# Patient Record
Sex: Male | Born: 1935 | Race: Black or African American | Hispanic: No | Marital: Single | Smoking: Never smoker
Health system: Southern US, Community
[De-identification: ages and names within clinical notes are randomized; demographics above are authoritative.]

## PROBLEM LIST (undated history)

## (undated) DIAGNOSIS — E785 Hyperlipidemia, unspecified: Secondary | ICD-10-CM

## (undated) DIAGNOSIS — C61 Malignant neoplasm of prostate: Secondary | ICD-10-CM

## (undated) DIAGNOSIS — I10 Essential (primary) hypertension: Secondary | ICD-10-CM

## (undated) HISTORY — DX: Essential (primary) hypertension: I10

## (undated) HISTORY — DX: Malignant neoplasm of prostate: C61

## (undated) HISTORY — DX: Hyperlipidemia, unspecified: E78.5

## (undated) HISTORY — PX: OTHER SURGICAL HISTORY: SHX169

---

## 2015-02-04 DIAGNOSIS — E039 Hypothyroidism, unspecified: Secondary | ICD-10-CM | POA: Diagnosis not present

## 2015-02-04 DIAGNOSIS — E119 Type 2 diabetes mellitus without complications: Secondary | ICD-10-CM | POA: Diagnosis not present

## 2015-02-04 DIAGNOSIS — E785 Hyperlipidemia, unspecified: Secondary | ICD-10-CM | POA: Diagnosis not present

## 2015-02-04 DIAGNOSIS — J309 Allergic rhinitis, unspecified: Secondary | ICD-10-CM | POA: Diagnosis not present

## 2015-02-04 DIAGNOSIS — I10 Essential (primary) hypertension: Secondary | ICD-10-CM | POA: Diagnosis not present

## 2015-06-07 DIAGNOSIS — J301 Allergic rhinitis due to pollen: Secondary | ICD-10-CM | POA: Diagnosis not present

## 2015-06-07 DIAGNOSIS — J329 Chronic sinusitis, unspecified: Secondary | ICD-10-CM | POA: Diagnosis not present

## 2015-06-07 DIAGNOSIS — J069 Acute upper respiratory infection, unspecified: Secondary | ICD-10-CM | POA: Diagnosis not present

## 2015-08-19 DIAGNOSIS — R05 Cough: Secondary | ICD-10-CM | POA: Diagnosis not present

## 2015-08-19 DIAGNOSIS — Z23 Encounter for immunization: Secondary | ICD-10-CM | POA: Diagnosis not present

## 2015-08-19 DIAGNOSIS — I10 Essential (primary) hypertension: Secondary | ICD-10-CM | POA: Diagnosis not present

## 2015-08-19 DIAGNOSIS — E039 Hypothyroidism, unspecified: Secondary | ICD-10-CM | POA: Diagnosis not present

## 2015-08-19 DIAGNOSIS — E119 Type 2 diabetes mellitus without complications: Secondary | ICD-10-CM | POA: Diagnosis not present

## 2015-08-19 DIAGNOSIS — E785 Hyperlipidemia, unspecified: Secondary | ICD-10-CM | POA: Diagnosis not present

## 2015-08-23 DIAGNOSIS — R918 Other nonspecific abnormal finding of lung field: Secondary | ICD-10-CM | POA: Diagnosis not present

## 2015-08-23 DIAGNOSIS — R911 Solitary pulmonary nodule: Secondary | ICD-10-CM | POA: Diagnosis not present

## 2015-08-23 DIAGNOSIS — R05 Cough: Secondary | ICD-10-CM | POA: Diagnosis not present

## 2015-08-27 ENCOUNTER — Other Ambulatory Visit: Payer: Self-pay | Admitting: Family Medicine

## 2015-08-27 DIAGNOSIS — R911 Solitary pulmonary nodule: Secondary | ICD-10-CM

## 2015-09-02 ENCOUNTER — Ambulatory Visit
Admission: RE | Admit: 2015-09-02 | Discharge: 2015-09-02 | Disposition: A | Discharge status: Home / Self Care | Payer: Medicare Other | Source: Ambulatory Visit | Attending: Family Medicine | Admitting: Family Medicine

## 2015-09-02 DIAGNOSIS — R911 Solitary pulmonary nodule: Secondary | ICD-10-CM | POA: Diagnosis not present

## 2015-09-06 ENCOUNTER — Other Ambulatory Visit: Payer: Self-pay | Admitting: *Deleted

## 2015-09-06 DIAGNOSIS — K7689 Other specified diseases of liver: Secondary | ICD-10-CM

## 2015-09-16 ENCOUNTER — Ambulatory Visit
Admission: RE | Admit: 2015-09-16 | Discharge: 2015-09-16 | Disposition: A | Discharge status: Home / Self Care | Payer: Medicare Other | Source: Ambulatory Visit | Attending: Family Medicine | Admitting: Family Medicine

## 2015-09-16 DIAGNOSIS — K7689 Other specified diseases of liver: Secondary | ICD-10-CM | POA: Diagnosis not present

## 2015-10-07 DIAGNOSIS — R05 Cough: Secondary | ICD-10-CM | POA: Diagnosis not present

## 2015-10-07 DIAGNOSIS — K769 Liver disease, unspecified: Secondary | ICD-10-CM | POA: Diagnosis not present

## 2015-10-07 DIAGNOSIS — I77810 Thoracic aortic ectasia: Secondary | ICD-10-CM | POA: Diagnosis not present

## 2015-10-07 DIAGNOSIS — J8410 Pulmonary fibrosis, unspecified: Secondary | ICD-10-CM | POA: Diagnosis not present

## 2015-10-07 DIAGNOSIS — K7689 Other specified diseases of liver: Secondary | ICD-10-CM | POA: Diagnosis not present

## 2015-10-07 DIAGNOSIS — R918 Other nonspecific abnormal finding of lung field: Secondary | ICD-10-CM | POA: Diagnosis not present

## 2015-10-07 DIAGNOSIS — E559 Vitamin D deficiency, unspecified: Secondary | ICD-10-CM | POA: Diagnosis not present

## 2015-10-28 DIAGNOSIS — R05 Cough: Secondary | ICD-10-CM | POA: Diagnosis not present

## 2015-11-05 DIAGNOSIS — R0602 Shortness of breath: Secondary | ICD-10-CM | POA: Diagnosis not present

## 2015-11-08 DIAGNOSIS — C61 Malignant neoplasm of prostate: Secondary | ICD-10-CM | POA: Diagnosis not present

## 2015-11-08 DIAGNOSIS — Z87891 Personal history of nicotine dependence: Secondary | ICD-10-CM | POA: Diagnosis not present

## 2015-11-08 DIAGNOSIS — R350 Frequency of micturition: Secondary | ICD-10-CM | POA: Diagnosis not present

## 2015-11-08 DIAGNOSIS — R972 Elevated prostate specific antigen [PSA]: Secondary | ICD-10-CM | POA: Diagnosis not present

## 2015-12-09 DIAGNOSIS — R918 Other nonspecific abnormal finding of lung field: Secondary | ICD-10-CM | POA: Diagnosis not present

## 2015-12-09 DIAGNOSIS — R05 Cough: Secondary | ICD-10-CM | POA: Diagnosis not present

## 2015-12-09 DIAGNOSIS — R0902 Hypoxemia: Secondary | ICD-10-CM | POA: Diagnosis not present

## 2016-02-10 DIAGNOSIS — E119 Type 2 diabetes mellitus without complications: Secondary | ICD-10-CM | POA: Diagnosis not present

## 2016-02-10 DIAGNOSIS — I10 Essential (primary) hypertension: Secondary | ICD-10-CM | POA: Diagnosis not present

## 2016-02-10 DIAGNOSIS — R05 Cough: Secondary | ICD-10-CM | POA: Diagnosis not present

## 2016-02-10 DIAGNOSIS — E039 Hypothyroidism, unspecified: Secondary | ICD-10-CM | POA: Diagnosis not present

## 2016-02-10 DIAGNOSIS — E785 Hyperlipidemia, unspecified: Secondary | ICD-10-CM | POA: Diagnosis not present

## 2016-02-18 DIAGNOSIS — G4733 Obstructive sleep apnea (adult) (pediatric): Secondary | ICD-10-CM | POA: Diagnosis not present

## 2016-03-02 DIAGNOSIS — R05 Cough: Secondary | ICD-10-CM | POA: Diagnosis not present

## 2016-03-24 ENCOUNTER — Ambulatory Visit (INDEPENDENT_AMBULATORY_CARE_PROVIDER_SITE_OTHER): Payer: Medicare Other | Admitting: Internal Medicine

## 2016-03-24 ENCOUNTER — Encounter: Payer: Self-pay | Admitting: Internal Medicine

## 2016-03-24 VITALS — BP 128/70 | HR 74 | Ht 64.0 in | Wt 157.8 lb

## 2016-03-24 DIAGNOSIS — R058 Other specified cough: Secondary | ICD-10-CM

## 2016-03-24 DIAGNOSIS — R05 Cough: Secondary | ICD-10-CM | POA: Diagnosis not present

## 2016-03-24 MED ORDER — ACETAMINOPHEN-CODEINE #3 300-30 MG PO TABS
1.0000 | ORAL_TABLET | ORAL | Status: DC | PRN
Start: 2016-03-24 — End: 2016-04-22

## 2016-03-24 MED ORDER — PANTOPRAZOLE SODIUM 40 MG PO TBEC: 40.0000 mg | DELAYED_RELEASE_TABLET | Freq: Every day | ORAL | Status: DC

## 2016-03-24 MED ORDER — FAMOTIDINE 20 MG PO TABS: ORAL_TABLET | ORAL | Status: DC

## 2016-03-24 NOTE — Progress Notes (Addendum)
   Subjective:    Patient ID: Steven Matthews, male    DOB: 03/06/1931,    MRN: [id number]  HPI  11 yobm ? Mentally retarded  never smoker referred to pulmonary clinic 03/24/2016 by Dr Rory Percy in [place] [facility] for cough   03/25/2016 1st [facility] office visit/ Steven Matthews   Chief Complaint  Patient presents with  . Pulmonary Consult    Referred by Dr. Hayden Pedro. Pt c/o cough for the past year. Cough is non prod and occurs without any specific trigger.    onset was insidious pattern is persistent daily assoc with urge to clear throat but worse day than noct / sporadic and niece assures not related to eating or activity/ exposures. Per pulmonary notes neg resp to ppi,  singulair or zyrtec with neg CT chest and nl pfts done this past year and neg RAST dated 10/07/15   No obvious other patterns in day to day or daytime variabilty or assoc limiting sob or cp or chest tightness, subjective wheeze overt sinus or hb symptoms. No unusual exp hx or h/o childhood pna/ asthma or knowledge of premature birth.  Sleeping ok without nocturnal  or early am exacerbation  of respiratory  c/o's or need for noct saba. Also denies any obvious fluctuation of symptoms with weather or environmental changes or other aggravating or alleviating factors except as outlined above   Current Medications, Allergies, Complete Past Medical History, Past Surgical History, Family History, and Social History were reviewed in [employer] record.               Review of Systems  Constitutional: Negative for fever, chills, activity change, appetite change and unexpected weight change.  HENT: Positive for dental problem. Negative for congestion, postnasal drip, rhinorrhea, sneezing, sore throat, trouble swallowing and voice change.   Eyes: Negative for visual disturbance.  Respiratory: Positive for cough. Negative for choking and shortness of breath.   Cardiovascular: Negative for chest pain and leg swelling.    Gastrointestinal: Negative for nausea, vomiting and abdominal pain.  Genitourinary: Negative for difficulty urinating.  Musculoskeletal: Negative for arthralgias.  Skin: Negative for rash.  Psychiatric/Behavioral: Negative for behavioral problems and confusion.       Objective:   Physical Exam  amb elderly bm not sure why he's here/ very inarticulate even with yes no responses/ silly affect  Wt Readings from Last 3 Encounters:  03/24/16 157 lb 12.8 oz (71.578 kg)    Vital signs reviewed    HEENT: nl   turbinates, and oropharynx. Nl external ear canals without cough reflex - very poor dentition   NECK :  without JVD/Nodes/TM/ nl carotid upstrokes bilaterally   LUNGS: no acc muscle use,  Nl contour chest which is clear to A and P bilaterally without cough on insp or exp maneuvers   CV:  RRR  no s3 or murmur or increase in P2, no edema   ABD:  soft and nontender with nl inspiratory excursion in the supine position. No bruits or organomegaly, bowel sounds nl  MS:  Nl gait/ ext warm without deformities, calf tenderness, cyanosis or clubbing No obvious joint restrictions   SKIN: warm and dry without lesions    NEURO:  alert, childlike , nl sensorium with  no motor deficits apparent   CT chest 10/07/15 Calcified granuloma only           Assessment & Plan:

## 2016-03-24 NOTE — Patient Instructions (Addendum)
Pantoprazole (protonix) 40 mg(omeprazole 20 x 2))    Take  30-60 min before first meal of the day and Pepcid (famotidine)  20 mg one @  bedtime until return to office - this is the best way to tell whether stomach acid is contributing to your problem.       For drainage / throat tickle try take CHLORPHENIRAMINE  4 mg - take one every 4 hours as needed - available over the counter- may cause drowsiness so start with just a bedtime dose or two and see how you tolerate it before trying in daytime     GERD (REFLUX)  is an extremely common cause of respiratory symptoms just like yours , many times with no obvious heartburn at all.    It can be treated with medication, but also with lifestyle changes including elevation of the head of your bed (ideally with 6 inch  bed blocks),  Smoking cessation, avoidance of late meals, excessive alcohol, and avoid fatty foods, chocolate, peppermint, colas, red wine, and acidic juices such as orange juice.  NO MINT OR MENTHOL PRODUCTS SO NO COUGH DROPS  USE SUGARLESS CANDY INSTEAD (Jolley ranchers or Stover's or Life Savers) or even ice chips will also do - the key is to swallow to prevent all throat clearing. NO OIL BASED VITAMINS - use powdered substitutes.  If still bothered by cough ok to try tylenol #3 up to one every 4 hours as needed   If not better after 4 weeks please return

## 2016-03-25 DIAGNOSIS — R05 Cough: Secondary | ICD-10-CM | POA: Insufficient documentation

## 2016-03-25 DIAGNOSIS — R058 Other specified cough: Secondary | ICD-10-CM | POA: Insufficient documentation

## 2016-03-25 NOTE — Assessment & Plan Note (Signed)
The most common causes of chronic cough in immunocompetent adults include the following: upper airway cough syndrome (UACS), previously referred to as postnasal drip syndrome (PNDS), which is caused by variety of rhinosinus conditions; (2) asthma; (3) GERD; (4) chronic bronchitis from cigarette smoking or other inhaled environmental irritants; (5) nonasthmatic eosinophilic bronchitis; and (6) bronchiectasis.   These conditions, singly or in combination, have accounted for up to 94% of the causes of chronic cough in prospective studies.   Other conditions have constituted no >6% of the causes in prospective studies These have included bronchogenic carcinoma, chronic interstitial pneumonia, sarcoidosis, left ventricular failure, ACEI-induced cough, and aspiration from a condition associated with pharyngeal dysfunction.    Chronic cough is often simultaneously caused by more than one condition. A single cause has been found from 38 to 82% of the time, multiple causes from 18 to 62%. Multiply caused cough has been the result of three diseases up to 42% of the time.       Based on hx and exam, this is most likely:  Classic Upper airway cough syndrome, so named because it's frequently impossible to sort out how much is  CR/sinusitis with freq throat clearing (which can be related to primary GERD)   vs  causing  secondary (" extra esophageal")  GERD from wide swings in gastric pressure that occur with throat clearing, often  promoting self use of mint and menthol lozenges that reduce the lower esophageal sphincter tone and exacerbate the problem further in a cyclical fashion.   These are the same pts (now being labeled as having "irritable larynx syndrome" by some cough centers) who not infrequently have a history of having failed to tolerate ace inhibitors,  dry powder inhalers or biphosphonates or report having atypical reflux symptoms that don't respond to standard doses of PPI , and are easily confused as  having aecopd or asthma flares by even experienced allergists/ pulmonologists.   The first step is to maximize acid suppression / gerd diet/ H1 and eliminate cyclical coughing with hard rock  then regroup if the cough persists.  I had an extended discussion with the patient and niece reviewing all relevant studies completed to date and  lasting 35 min/60 min initial ov including extensive review of the w/u already done in [place] by pulmonary   Of the three most common causes of chronic cough, only one (GERD)  can actually cause the other two (asthma and post nasal drip syndrome)  and perpetuate the cylce of cough inducing airway trauma, inflammation, heightened sensitivity to reflux which is prompted by the cough itself via a cyclical mechanism.    This may partially respond to steroids and look like asthma and post nasal drainage but never erradicated completely unless the cough and the secondary reflux are eliminated, preferably both at the same time.  While not intuitively obvious, many patients with chronic low grade reflux do not cough until there is a secondary insult that disturbs the protective epithelial barrier and exposes sensitive nerve endings.  This can be viral or direct physical injury such as with an endotracheal tube.   The point is that once this occurs, it is difficult to eliminate using anything but a maximally effective acid suppression regimen at least in the short run, accompanied by an appropriate diet to address non acid GERD.   Each maintenance medication was reviewed in detail including most importantly the difference between maintenance and as needed and under what circumstances the prns are to be used.  Please see instructions for details which were reviewed in writing and the patient given a copy.

## 2016-04-01 ENCOUNTER — Telehealth: Payer: Self-pay | Admitting: Internal Medicine

## 2016-04-01 DIAGNOSIS — R05 Cough: Secondary | ICD-10-CM

## 2016-04-01 DIAGNOSIS — R058 Other specified cough: Secondary | ICD-10-CM

## 2016-04-01 MED ORDER — GABAPENTIN 100 MG PO CAPS: 100.0000 mg | ORAL_CAPSULE | Freq: Three times a day (TID) | ORAL | Status: DC

## 2016-04-01 NOTE — Telephone Encounter (Signed)
Start neurontin 100 mg tid and f/u in 2 weeks with all meds in hand and ok to refill tyl#3 x one refill to use prn

## 2016-04-01 NOTE — Telephone Encounter (Signed)
Spoke with Velva Harman, pt's niece, and advised of MW's recommendations. She would like him to start the Nuerontin but does not want to give him Tylenol#3 again. She states that the last time he took it he got very light-headed, hot and felt like he was "going to pass out". She made appt for 2 weeks with TP for med calendar. Rx sent to pharmacy. Nothing further needed.

## 2016-04-01 NOTE — Telephone Encounter (Signed)
Spoke with Rita(niece), cough is worse since last OV, non-productive and frequent.  Patient is using all meds as directed and still having cough. Pt's sister passed away and he will be attending the funeral this week and is asking for something to be called in for additional cough suppressant for short term use. Please advise Dr Melvyn Novas. Thanks.  Patient Instructions     Pantoprazole (protonix) 40 mg(omeprazole 20 x 2)) Take 30-60 min before first meal of the day and Pepcid (famotidine) 20 mg one @ bedtime until return to office - this is the best way to tell whether stomach acid is contributing to your problem.   For drainage / throat tickle try take CHLORPHENIRAMINE 4 mg - take one every 4 hours as needed - available over the counter- may cause drowsiness so start with just a bedtime dose or two and see how you tolerate it before trying in daytime   GERD (REFLUX) is an extremely common cause of respiratory symptoms just like yours , many times with no obvious heartburn at all.   It can be treated with medication, but also with lifestyle changes including elevation of the head of your bed (ideally with 6 inch bed blocks), Smoking cessation, avoidance of late meals, excessive alcohol, and avoid fatty foods, chocolate, peppermint, colas, red wine, and acidic juices such as orange juice.  NO MINT OR MENTHOL PRODUCTS SO NO COUGH DROPS  USE SUGARLESS CANDY INSTEAD (Jolley ranchers or Stover's or Life Savers) or even ice chips will also do - the key is to swallow to prevent all throat clearing. NO OIL BASED VITAMINS - use powdered substitutes.  If still bothered by cough ok to try tylenol #3 up to one every 4 hours as needed   If not better after 4 weeks please return

## 2016-04-17 ENCOUNTER — Encounter: Payer: Self-pay | Admitting: Internal Medicine

## 2016-04-17 ENCOUNTER — Ambulatory Visit (INDEPENDENT_AMBULATORY_CARE_PROVIDER_SITE_OTHER): Payer: Medicare Other | Admitting: Adult Health

## 2016-04-17 ENCOUNTER — Encounter: Payer: Self-pay | Admitting: Adult Health

## 2016-04-17 ENCOUNTER — Telehealth: Payer: Self-pay | Admitting: Internal Medicine

## 2016-04-17 VITALS — BP 130/76 | HR 74 | Temp 98.1°F | Ht 64.0 in | Wt 157.0 lb

## 2016-04-17 DIAGNOSIS — R058 Other specified cough: Secondary | ICD-10-CM

## 2016-04-17 DIAGNOSIS — R05 Cough: Secondary | ICD-10-CM

## 2016-04-17 NOTE — Progress Notes (Signed)
Subjective:    Patient ID: Steven Matthews, male    DOB: Jan 10, 1931,    MRN: [id number]  HPI  7 yobm ? Mentally retarded  never smoker referred to pulmonary clinic 03/24/2016 by Dr Rory Percy in [place] for cough   03/25/2016 1st [facility] office visit/ Wert   Chief Complaint  Patient presents with  . Pulmonary Consult    Referred by Dr. Hayden Pedro. Pt c/o cough for the past year. Cough is non prod and occurs without any specific trigger.    onset was insidious pattern is persistent daily assoc with urge to clear throat but worse day than noct / sporadic and niece assures not related to eating or activity/ exposures. Per pulmonary notes neg resp to ppi,  singulair or zyrtec with neg CT chest and nl pfts done this past year and neg RAST dated 10/07/15  >>PPI rx . , Gabapentin , chlor tabls   04/17/2016 Follow up :  Patient returns for a two-week follow-up for cough and medication review We reviewed all her medications organize them into a medication calendar with patient education.  Appears to be taking medications correctly except for taking protonix and omprazole and not pepcid.  Patient was seen last visit for pulmonary consult for cough. He was started on cough control regimen along with GERD tx.  Has had only minimal improvement in cough.  Could not tolerate tylenol #3 for cough .  Denies any chest pain, orthopnea, PND, or increased leg swelling.  Current Medications, Allergies, Complete Past Medical History, Past Surgical History, Family History, and Social History were reviewed in [employer] record.    Review of Systems  Constitutional:   No  weight loss, night sweats,  Fevers, chills, fatigue, or  lassitude.  HEENT:   No headaches,  Difficulty swallowing,  Tooth/dental problems, or  Sore throat,                No sneezing, itching, ear ache,  +nasal congestion, post nasal drip,   CV:  No chest pain,  Orthopnea, PND, swelling in lower extremities,  anasarca, dizziness, palpitations, syncope.   GI  No heartburn, indigestion, abdominal pain, nausea, vomiting, diarrhea, change in bowel habits, loss of appetite, bloody stools.   Resp:    No chest wall deformity  Skin: no rash or lesions.  GU: no dysuria, change in color of urine, no urgency or frequency.  No flank pain, no hematuria   MS:  No joint pain or swelling.  No decreased range of motion.  No back pain.  Psych:  No change in mood or affect. No depression or anxiety.  No memory loss.         Objective:   Physical Exam  amb elderly Filed Vitals:   04/17/16 1519  BP: 130/76  Pulse: 74  Temp: 98.1 F (36.7 C)  TempSrc: Oral  Height: 5\' 4"  (1.626 m)  Weight: 157 lb (71.215 kg)  SpO2: 93%    Vital signs reviewed    HEENT: nl   turbinates, and oropharynx. Nl external ear canals without cough reflex - very poor dentition   NECK :  without JVD/Nodes/TM/ nl carotid upstrokes bilaterally   LUNGS: no acc muscle use,  Nl contour chest which is clear to A and P bilaterally without cough on insp or exp maneuvers   CV:  RRR  no s3 or murmur or increase in P2, no edema   ABD:  soft and nontender with nl inspiratory excursion in  the supine position. No bruits or organomegaly, bowel sounds nl  MS:  Nl gait/ ext warm without deformities, calf tenderness, cyanosis or clubbing No obvious joint restrictions   SKIN: warm and dry without lesions    NEURO:  alert, childlike , nl sensorium with  no motor deficits apparent   CT chest 10/07/15 Calcified granuloma only    Dearia Wilmouth NP-C  [facility]  04/17/2016          Assessment & Plan:

## 2016-04-17 NOTE — Progress Notes (Signed)
Chart and office note reviewed in detail  > agree with a/p as outlined    

## 2016-04-17 NOTE — Telephone Encounter (Signed)
Spoke with Velva Harman. States that pt need a prescription for Pepcid. Advised her that this is OTC. She states that she will go pick this up. Nothing further was needed.

## 2016-04-17 NOTE — Assessment & Plan Note (Signed)
Patient's medications were reviewed today and patient education was given. Computerized medication calendar was adjusted/completed   Plan   May take Ompeprazole or Protonix daily before meal  Add Pepcid 20mg  At bedtime   May use Delsym 2 tsp Twice daily  As needed  Cough.  Follow med calendar closely and bring to each visit.  Follow up Dr. Melvyn Novas  In 6-8 weeks and As needed   Please contact office for sooner follow up if symptoms do not improve or worsen or seek emergency care

## 2016-04-17 NOTE — Patient Instructions (Addendum)
May take Ompeprazole or Protonix daily before meal  Add Pepcid 20mg  At bedtime   May use Delsym 2 tsp Twice daily  As needed  Cough.  Follow med calendar closely and bring to each visit.  Follow up Dr. Melvyn Novas  In 6-8 weeks and As needed   Please contact office for sooner follow up if symptoms do not improve or worsen or seek emergency care

## 2016-04-22 ENCOUNTER — Encounter: Payer: Self-pay | Admitting: Internal Medicine

## 2016-04-22 NOTE — Addendum Note (Signed)
Addended by: Osa Craver on: 04/22/2016 12:06 PM   Modules accepted: Orders, Medications

## 2016-04-23 ENCOUNTER — Encounter: Payer: Self-pay | Admitting: Internal Medicine

## 2016-04-23 DIAGNOSIS — J9611 Chronic respiratory failure with hypoxia: Secondary | ICD-10-CM | POA: Insufficient documentation

## 2016-04-23 DIAGNOSIS — G4733 Obstructive sleep apnea (adult) (pediatric): Secondary | ICD-10-CM | POA: Insufficient documentation

## 2016-06-02 ENCOUNTER — Other Ambulatory Visit (INDEPENDENT_AMBULATORY_CARE_PROVIDER_SITE_OTHER): Payer: Medicare Other

## 2016-06-02 ENCOUNTER — Encounter: Payer: Self-pay | Admitting: Internal Medicine

## 2016-06-02 ENCOUNTER — Ambulatory Visit (INDEPENDENT_AMBULATORY_CARE_PROVIDER_SITE_OTHER): Payer: Medicare Other | Admitting: Internal Medicine

## 2016-06-02 VITALS — BP 130/78 | HR 74 | Ht 64.0 in | Wt 163.0 lb

## 2016-06-02 DIAGNOSIS — R05 Cough: Secondary | ICD-10-CM | POA: Diagnosis not present

## 2016-06-02 DIAGNOSIS — R058 Other specified cough: Secondary | ICD-10-CM

## 2016-06-02 LAB — CBC WITH DIFFERENTIAL/PLATELET
BASOS ABS: 0.1 10*3/uL (ref 0.0–0.1)
Basophils Relative: 1 % (ref 0.0–3.0)
EOS PCT: 7.3 % — AB (ref 0.0–5.0)
Eosinophils Absolute: 0.5 10*3/uL (ref 0.0–0.7)
HEMATOCRIT: 38.1 % — AB (ref 39.0–52.0)
Hemoglobin: 12.6 g/dL — ABNORMAL LOW (ref 13.0–17.0)
LYMPHS PCT: 25.1 % (ref 12.0–46.0)
Lymphs Abs: 1.6 10*3/uL (ref 0.7–4.0)
MCHC: 33 g/dL (ref 30.0–36.0)
MCV: 85.2 fl (ref 78.0–100.0)
MONOS PCT: 10.1 % (ref 3.0–12.0)
Monocytes Absolute: 0.7 10*3/uL (ref 0.1–1.0)
NEUTROS ABS: 3.7 10*3/uL (ref 1.4–7.7)
Neutrophils Relative %: 56.5 % (ref 43.0–77.0)
PLATELETS: 205 10*3/uL (ref 150.0–400.0)
RBC: 4.47 Mil/uL (ref 4.22–5.81)
RDW: 16.3 % — ABNORMAL HIGH (ref 11.5–15.5)
WBC: 6.5 10*3/uL (ref 4.0–10.5)

## 2016-06-02 NOTE — Patient Instructions (Addendum)
Add chlortabs x 2 and pepcid 20mg  at one hour before bedtime to see what the impact is  Change omeprazole to take x 2 = 40 mg or 40 mg of pantoprazole before first meal  See calendar for specific medication instructions and bring it back for each and every office visit for every healthcare provider you see.  Without it,  you may not receive the best quality medical care that we feel you deserve.  You will note that the calendar groups together  your maintenance  medications that are timed at particular times of the day.  Think of this as your checklist for what your doctor has instructed you to do until your next evaluation to see what benefit  there is  to staying on a consistent group of medications intended to keep you well.  The other group at the bottom is entirely up to you to use as you see fit  for specific symptoms that may arise between visits that require you to treat them on an as needed basis.  Think of this as your action plan or "what if" list.   Separating the top medications from the bottom group is fundamental to providing you adequate care going forward.    Please remember to go to the lab   department downstairs for your tests - we will call you with the results when they are available.     Please schedule a follow up office visit in 6 weeks, call sooner if needed  Next step sinus ct/ DgEs

## 2016-06-02 NOTE — Progress Notes (Signed)
Subjective:    Patient ID: Steven Matthews, male    DOB: October 12, 1931,    MRN: [id number]    Brief patient profile:  85 yobm ? Mentally retarded  never smoker referred to pulmonary clinic 03/24/2016 by Dr Rory Percy in [place] for cough onset late spring /summer 2016     History of Present Illness  03/25/2016 1st [facility] office visit/ Wert   Chief Complaint  Patient presents with  . Pulmonary Consult    Referred by Dr. Hayden Pedro. Pt c/o cough for the past year. Cough is non prod and occurs without any specific trigger.   onset was insidious pattern is persistent daily assoc with urge to clear throat but worse day than noct / sporadic and niece assures not related to eating or activity/ exposures. Per pulmonary notes neg resp to ppi,  singulair or zyrtec with neg CT chest and nl pfts done this past year and neg RAST dated 10/07/15  >>PPI rx . , Gabapentin 100 , chlor tabls     04/17/2016 NP Follow up :  Patient returns for a two-week follow-up for cough and medication review We reviewed all her medications organize them into a medication calendar with patient education.  Appears to be taking medications correctly except for taking protonix and omprazole and not pepcid.  Patient was seen last visit for pulmonary consult for cough. He was started on cough control regimen along with GERD tx.  Has had only minimal improvement in cough.  Could not tolerate tylenol #3 for cough .  rec May take Ompeprazole or Protonix daily before meal  Add Pepcid 20mg  At bedtime   May use Delsym 2 tsp Twice daily  As needed  Cough.     06/02/2016  f/u ov/Wert re: chronic  cough / no med calendar on gaabpentin 100 tid/prilosec/pepcid  Chief Complaint  Patient presents with  . Follow-up    pt states cough is baseline since last OV. c/o cont non prod. no new concerns today.  noct cough is consistent w/in a few minutes of lying down but no excess/ purulent sputum or mucus plugs  Or hemoptysis No relation to  meals, sporadic during the day, no better with delsym   Not limited by breathing from desired activities    No obvious day to day or daytime variability or assoc  cp or chest tightness, subjective wheeze or overt sinus or hb symptoms. No unusual exp hx or h/o childhood pna/ asthma or knowledge of premature birth.  Sleeping ok without nocturnal  or early am exacerbation  of respiratory  c/o's or need for noct saba. Also denies any obvious fluctuation of symptoms with weather or environmental changes or other aggravating or alleviating factors except as outlined above   Current Medications, Allergies, Complete Past Medical History, Past Surgical History, Family History, and Social History were reviewed in [employer] record.  ROS  The following are not active complaints unless bolded sore throat, dysphagia, dental problems, itching, sneezing,  nasal congestion or excess/ purulent secretions, ear ache,   fever, chills, sweats, unintended wt loss, classically pleuritic or exertional cp, hemoptysis,  orthopnea pnd or leg swelling, presyncope, palpitations, abdominal pain, anorexia, nausea, vomiting, diarrhea  or change in bowel or bladder habits, change in stools or urine, dysuria,hematuria,  rash, arthralgias, visual complaints, headache, numbness, weakness or ataxia or problems with walking or coordination,  change in mood/affect or memory.  Objective:   Physical Exam  amb elderly bm nad   Wt Readings from Last 3 Encounters:  06/02/16 163 lb (73.936 kg)  04/17/16 157 lb (71.215 kg)  03/24/16 157 lb 12.8 oz (71.578 kg)    Vital signs reviewed    HEENT: nl   turbinates, and oropharynx. Nl external ear canals without cough reflex - very poor dentition   NECK :  without JVD/Nodes/TM/ nl carotid upstrokes bilaterally   LUNGS: no acc muscle use,  Nl contour chest which is clear to A and P bilaterally without cough on insp or exp maneuvers   CV:   RRR  no s3 or murmur or increase in P2, no edema   ABD:  soft and nontender with nl inspiratory excursion in the supine position. No bruits or organomegaly, bowel sounds nl  MS:  Nl gait/ ext warm without deformities, calf tenderness, cyanosis or clubbing No obvious joint restrictions   SKIN: warm and dry without lesions    NEURO:  alert, childlike , nl sensorium with  no motor deficits apparent      CT chest 10/07/15 Calcified granuloma only            Assessment & Plan:

## 2016-06-03 LAB — RESPIRATORY ALLERGY PROFILE REGION II ~~LOC~~
Allergen, Comm Silver Birch, t9: <0.1 kU/L
Allergen, Cottonwood, t14: <0.1 kU/L
Allergen, D pternoyssinus,d7: <0.1 kU/L
Allergen, Oak,t7: <0.1 kU/L
Bermuda Grass: <0.1 kU/L
Box Elder IgE: <0.1 kU/L
Cat Dander: <0.1 kU/L
D. farinae: <0.1 kU/L
Dog Dander: <0.1 kU/L
Elm IgE: <0.1 kU/L
IgE (Immunoglobulin E), Serum: 172 kU/L — ABNORMAL HIGH (ref <115)
Sheep Sorrel IgE: <0.1 kU/L

## 2016-06-03 NOTE — Progress Notes (Signed)
Quick Note:  Spoke with pt and notified of results per Dr. Wert. Pt verbalized understanding and denied any questions.  ______ 

## 2016-06-07 NOTE — Assessment & Plan Note (Signed)
PFTs 10/28/15 [place] > wnl Trial of max gerd/ H1/ diet started 03/25/2016 >>>  - 04/01/2016 no better so rec gabapentin 100 mg tid x 2 weeks trial then ov 04/17/2016 med calendar  - Allergy profile 06/02/2016 >  Eos 0.5 /  IgE  172 neg RAST  I had an extended discussion with the patient/care taker  reviewing all relevant studies completed to date and  lasting 15 to 20 minutes of a 25 minute visit on the following ongoing concerns:   The standardized cough guidelines published in Chest by Lissa Morales in 2006 are still the best available and consist of a multiple step process (up to 12!) , not a single office visit,  and are intended  to address this problem logically,  with an alogrithm dependent on response to empiric treatment at  each progressive step  to determine a specific diagnosis with  minimal addtional testing needed. Therefore if adherence is an issue or can't be accurately verified,  it's very unlikely the standard evaluation and treatment will be successful here.    Furthermore, response to therapy (other than acute cough suppression, which should only be used short term with avoidance of narcotic containing cough syrups if possible), can be a gradual process for which the patient is not likely to  perceive immediate benefit.  Unlike going to an eye doctor where the best perscription is almost always the first one and is immediately effective, this is almost never the case in the management of chronic cough syndromes. Therefore the patient needs to commit up front to consistently adhere to recommendations  for up to 6 weeks of therapy directed at the likely underlying problem(s) before the response can be reasonably evaluated.   Emphasized use of med calendar to assure instructions are being followed correctly/consistently before considering additional steps     Each maintenance medication was reviewed in detail including most importantly the difference between maintenance and as needed  and under what circumstances the prns are to be used. This was done in the context of a medication calendar review which provided the patient with a user-friendly unambiguous mechanism for medication administration and reconciliation and provides an action plan for all active problems. It is critical that this be shown to every doctor  for modification during the office visit if necessary so the patient can use it as a working document.

## 2016-06-08 DIAGNOSIS — H25013 Cortical age-related cataract, bilateral: Secondary | ICD-10-CM | POA: Diagnosis not present

## 2016-06-08 DIAGNOSIS — H40003 Preglaucoma, unspecified, bilateral: Secondary | ICD-10-CM | POA: Diagnosis not present

## 2016-06-08 DIAGNOSIS — H40013 Open angle with borderline findings, low risk, bilateral: Secondary | ICD-10-CM | POA: Diagnosis not present

## 2016-06-08 DIAGNOSIS — Z83511 Family history of glaucoma: Secondary | ICD-10-CM | POA: Diagnosis not present

## 2016-07-14 ENCOUNTER — Ambulatory Visit (INDEPENDENT_AMBULATORY_CARE_PROVIDER_SITE_OTHER)
Admission: RE | Admit: 2016-07-14 | Discharge: 2016-07-14 | Disposition: A | Discharge status: Home / Self Care | Payer: Medicare Other | Source: Ambulatory Visit | Attending: Internal Medicine | Admitting: Internal Medicine

## 2016-07-14 ENCOUNTER — Encounter (INDEPENDENT_AMBULATORY_CARE_PROVIDER_SITE_OTHER): Payer: Self-pay

## 2016-07-14 ENCOUNTER — Encounter: Payer: Self-pay | Admitting: Internal Medicine

## 2016-07-14 ENCOUNTER — Ambulatory Visit (INDEPENDENT_AMBULATORY_CARE_PROVIDER_SITE_OTHER): Payer: Medicare Other | Admitting: Internal Medicine

## 2016-07-14 VITALS — BP 122/74 | HR 71 | Wt 165.0 lb

## 2016-07-14 DIAGNOSIS — R058 Other specified cough: Secondary | ICD-10-CM

## 2016-07-14 DIAGNOSIS — R05 Cough: Secondary | ICD-10-CM | POA: Diagnosis not present

## 2016-07-14 MED ORDER — GABAPENTIN 300 MG PO CAPS: 300.0000 mg | ORAL_CAPSULE | Freq: Three times a day (TID) | ORAL | 11 refills | Status: DC

## 2016-07-14 NOTE — Patient Instructions (Addendum)
Increase the neurontin to 300 mg three times as per med caldendar  Please see patient coordinator before you leave today  to schedule swallowing eval   Please remember to go to the   x-ray department downstairs for your tests - we will call you with the results when they are available.  See Tammy NP in 6 weeks with all your medications, even over the counter meds, separated in two separate bags, the ones you take no matter what vs the ones you stop once you feel better and take only as needed when you feel you need them.   Tammy  will generate for you a new user friendly medication calendar that will put [place] all on the same page re: your medication use.     Without this process, it simply isn't possible to assure that we are providing  your outpatient care  with  the attention to detail we feel you deserve.   If we cannot assure that you're getting that kind of care,  then we cannot manage your problem effectively from this clinic.  Once you have seen Tammy and we are sure that we're all on the same page with your medication use she will arrange follow up with me.  Add note records show noct desat in [place] ? Ever rx  02? Need to repeat ono?

## 2016-07-14 NOTE — Progress Notes (Signed)
Subjective:    Patient ID: Steven Matthews, male    DOB: 1931/08/21,    MRN: [id number]    Brief patient profile:  85 yobm ? Mentally retarded  never smoker referred to pulmonary clinic 03/24/2016 by Dr Rory Percy in [place] for cough onset late spring /summer 2016     History of Present Illness  03/25/2016 1st [facility] office visit/ Wert   Chief Complaint  Patient presents with  . Pulmonary Consult    Referred by Dr. Hayden Pedro. Pt c/o cough for the past year. Cough is non prod and occurs without any specific trigger.   onset was insidious pattern is persistent daily assoc with urge to clear throat but worse day than noct / sporadic and niece assures not related to eating or activity/ exposures. Per pulmonary notes neg resp to ppi,  singulair or zyrtec with neg CT chest and nl pfts done this past year and neg RAST dated 10/07/15  >>PPI rx . , Gabapentin 100 , chlor tabls     04/17/2016 NP Follow up :  Patient returns for a two-week follow-up for cough and medication review We reviewed all her medications organize them into a medication calendar with patient education.  Appears to be taking medications correctly except for taking protonix and omprazole and not pepcid.  Patient was seen last visit for pulmonary consult for cough. He was started on cough control regimen along with GERD tx.  Has had only minimal improvement in cough.  Could not tolerate tylenol #3 for cough .  rec May take Ompeprazole or Protonix daily before meal  Add Pepcid 20mg  At bedtime   May use Delsym 2 tsp Twice daily  As needed  Cough.     06/02/2016  f/u ov/Wert re: chronic  cough / no med calendar on gaabpentin 100 tid/prilosec/pepcid  Chief Complaint  Patient presents with  . Follow-up    pt states cough is baseline since last OV. c/o cont non prod. no new concerns today.  noct cough is consistent w/in a few minutes of lying down but no excess/ purulent sputum or mucus plugs  Or hemoptysis No relation to  meals, sporadic during the day, no better with delsym  rec Add chlortabs x 2 and pepcid 20mg  at one hour before bedtime to see what the impact is Change omeprazole to take x 2 = 40 mg or 40 mg of pantoprazole before first meal Separating the top medications from the bottom group is fundamental to providing you adequate care going forward.   Please remember to go to the lab   department > Neg RAST  Next step sinus ct/ DgEs     07/14/2016  f/u ov/Wert re: cough x at least spring 2016 when moved in with Niece / using med calendar  Chief Complaint  Patient presents with  . Follow-up    Cough is unchanged.   cough continues q hs and sporadic daytime/ dry  Not limited by breathing from desired activities    No obvious day to day or daytime variability or assoc excess/ purulent sputum or mucus plugs   cp or chest tightness, subjective wheeze or overt sinus or hb symptoms. No unusual exp hx or h/o childhood pna/ asthma or knowledge of premature birth.  Sleeping ok without nocturnal  or early am exacerbation  of respiratory  c/o's or need for noct saba. Also denies any obvious fluctuation of symptoms with weather or environmental changes or other aggravating or alleviating factors except as outlined  above   Current Medications, Allergies, Complete Past Medical History, Past Surgical History, Family History, and Social History were reviewed in [employer] record.  ROS  The following are not active complaints unless bolded sore throat, dysphagia, dental problems, itching, sneezing,  nasal congestion or excess/ purulent secretions, ear ache,   fever, chills, sweats, unintended wt loss, classically pleuritic or exertional cp, hemoptysis,  orthopnea pnd or leg swelling, presyncope, palpitations, abdominal pain, anorexia, nausea, vomiting, diarrhea  or change in bowel or bladder habits, change in stools or urine, dysuria,hematuria,  rash, arthralgias, visual complaints, headache,  numbness, weakness or ataxia or problems with walking or coordination,  change in mood/affect or memory.            Objective:   Physical Exam  amb elderly mentally retarded bm nad/ does not identify niece and never coughed once during interview or exam     07/14/2016      165  06/02/16 163 lb (73.936 kg)  04/17/16 157 lb (71.215 kg)  03/24/16 157 lb 12.8 oz (71.578 kg)    Vital signs reviewed    HEENT: nl   turbinates, and oropharynx. Nl external ear canals without cough reflex - very poor dentition   NECK :  without JVD/Nodes/TM/ nl carotid upstrokes bilaterally   LUNGS: no acc muscle use,  Nl contour chest which is clear to A and P bilaterally without cough on insp or exp maneuvers   CV:  RRR  no s3 or murmur or increase in P2, no edema   ABD:  soft and nontender with nl inspiratory excursion in the supine position. No bruits or organomegaly, bowel sounds nl  MS:  Nl gait/ ext warm without deformities, calf tenderness, cyanosis or clubbing No obvious joint restrictions   SKIN: warm and dry without lesions    NEURO:  alert, childlike , nl sensorium with  no motor deficits apparent       CXR PA and Lateral:   07/14/2016 :    I personally reviewed images and agree with radiology impression as follows:   There is no acute cardiopulmonary abnormality. Stable appearing calcified right upper lobe nodule consistent with previous granulomatous infection or hamartoma.            Assessment & Plan:

## 2016-07-15 NOTE — Assessment & Plan Note (Addendum)
PFTs 10/28/15 [place] > wnl Trial of max gerd/ H1/ diet started 03/25/2016 >>>  - 04/01/2016 no better so rec gabapentin 100 mg tid x 2 weeks trial then ov 04/17/2016 med calendar  - Allergy profile 06/02/2016 >  Eos 0.5 /  IgE  172 neg RAST -  07/14/2016 increase neurontin to 300 mg tid  - Dg Es 07/14/2016 >>>   This cough is a bit unusual for an upper airway problem in that it occurs more overnight and it does during the day, at least per the history. The cough doesn't appear to be bothering the patient at all but rather the caretaker and it's not even really clear how long the cough is been going on because he's been coughing as long as he has been staying with his niece but can't tell us whether he was coughing prior to that.  Per guidelines for irritable larynx syndrome I recommended increasing the Neurontin up to 300 mg 3 times a day and proceed with a diagnostic esophagram looking for either aspiration or reflux or both..  I had an extended discussion with the patient's niece reviewing all relevant studies completed to date and  lasting 15 to 20 minutes of a 25 minute visit    Each maintenance medication was reviewed in detail including most importantly the difference between maintenance and prns and under what circumstances the prns are to be triggered using an action plan format that is not reflected in the computer generated alphabetically organized AVS but trather by a customized med calendar that reflects the AVS meds with confirmed 100% correlation.   Please see instructions for details which were reviewed in writing and the patient given a copy highlighting the part that I personally wrote and discussed at today's ov.

## 2016-07-20 ENCOUNTER — Ambulatory Visit (HOSPITAL_COMMUNITY)
Admission: RE | Admit: 2016-07-20 | Discharge: 2016-07-20 | Disposition: A | Discharge status: Home / Self Care | Payer: Medicare Other | Source: Ambulatory Visit | Attending: Internal Medicine | Admitting: Internal Medicine

## 2016-07-20 DIAGNOSIS — K219 Gastro-esophageal reflux disease without esophagitis: Secondary | ICD-10-CM | POA: Diagnosis not present

## 2016-07-20 DIAGNOSIS — R05 Cough: Secondary | ICD-10-CM

## 2016-07-20 DIAGNOSIS — K224 Dyskinesia of esophagus: Secondary | ICD-10-CM | POA: Diagnosis not present

## 2016-07-20 DIAGNOSIS — R058 Other specified cough: Secondary | ICD-10-CM

## 2016-07-22 ENCOUNTER — Other Ambulatory Visit: Payer: Self-pay | Admitting: Internal Medicine

## 2016-07-22 ENCOUNTER — Encounter: Payer: Self-pay | Admitting: Gastroenterology

## 2016-07-22 DIAGNOSIS — R05 Cough: Secondary | ICD-10-CM

## 2016-07-22 DIAGNOSIS — R058 Other specified cough: Secondary | ICD-10-CM

## 2016-07-22 NOTE — Progress Notes (Signed)
Spoke with pt's niece and notified of results per Dr. Wert. Pt verbalized understanding and denied any questions. 

## 2016-08-03 ENCOUNTER — Encounter: Payer: Self-pay | Admitting: Internal Medicine

## 2016-08-03 ENCOUNTER — Ambulatory Visit (INDEPENDENT_AMBULATORY_CARE_PROVIDER_SITE_OTHER): Payer: Medicare Other | Admitting: Internal Medicine

## 2016-08-03 VITALS — BP 130/84 | HR 98 | Temp 97.8°F | Resp 16 | Ht 64.0 in | Wt 162.0 lb

## 2016-08-03 DIAGNOSIS — E039 Hypothyroidism, unspecified: Secondary | ICD-10-CM | POA: Insufficient documentation

## 2016-08-03 DIAGNOSIS — Z23 Encounter for immunization: Secondary | ICD-10-CM

## 2016-08-03 DIAGNOSIS — I10 Essential (primary) hypertension: Secondary | ICD-10-CM | POA: Insufficient documentation

## 2016-08-03 DIAGNOSIS — E1159 Type 2 diabetes mellitus with other circulatory complications: Secondary | ICD-10-CM | POA: Insufficient documentation

## 2016-08-03 DIAGNOSIS — E1169 Type 2 diabetes mellitus with other specified complication: Secondary | ICD-10-CM | POA: Insufficient documentation

## 2016-08-03 DIAGNOSIS — F419 Anxiety disorder, unspecified: Secondary | ICD-10-CM | POA: Insufficient documentation

## 2016-08-03 DIAGNOSIS — E785 Hyperlipidemia, unspecified: Secondary | ICD-10-CM

## 2016-08-03 DIAGNOSIS — Z8546 Personal history of malignant neoplasm of prostate: Secondary | ICD-10-CM | POA: Insufficient documentation

## 2016-08-03 DIAGNOSIS — F79 Unspecified intellectual disabilities: Secondary | ICD-10-CM | POA: Insufficient documentation

## 2016-08-03 DIAGNOSIS — N3281 Overactive bladder: Secondary | ICD-10-CM | POA: Insufficient documentation

## 2016-08-03 DIAGNOSIS — G4733 Obstructive sleep apnea (adult) (pediatric): Secondary | ICD-10-CM

## 2016-08-03 DIAGNOSIS — L602 Onychogryphosis: Secondary | ICD-10-CM | POA: Insufficient documentation

## 2016-08-03 MED ORDER — GABAPENTIN 100 MG PO CAPS: 100.0000 mg | ORAL_CAPSULE | Freq: Three times a day (TID) | ORAL | 5 refills | Status: DC

## 2016-08-03 NOTE — Assessment & Plan Note (Signed)
Referred to podiatry.

## 2016-08-03 NOTE — Assessment & Plan Note (Signed)
Following with urology Currently taking oxybutynin

## 2016-08-03 NOTE — Assessment & Plan Note (Signed)
We'll check TSH at his next visit Clinically euthyroid Continue current dose of levothyroxine

## 2016-08-03 NOTE — Assessment & Plan Note (Signed)
BP well controlled Current regimen effective and well tolerated Continue current medications at current doses  

## 2016-08-03 NOTE — Patient Instructions (Addendum)
  Flu vaccine administered today.   Medications reviewed and updated.  Changes include decreasing the gabapentin to 100 mg three times daily.  Your prescription(s) have been submitted to your pharmacy. Please take as directed and contact our office if you believe you are having problem(s) with the medication(s).  A referral was ordered for podiatry.   Please followup in 6 months

## 2016-08-03 NOTE — Progress Notes (Signed)
Pre visit review using our clinic review tool, if applicable. No additional management support is needed unless otherwise documented below in the visit note. 

## 2016-08-03 NOTE — Progress Notes (Signed)
Subjective:    Patient ID: Steven Matthews, male    DOB: Apr 07, 1931, 82 y.o.   MRN: [id number]  HPI He is here to establish with a new pcp.    Cough: He has had a cough for over a year.  He is following with dr Melvyn Novas. He is taking protonix and gabapentin.   He is only taking 100 mg of the gabapentin three times a times because his niece is concerned about possible side effects from medication. She would like to stay at a low dose. She recently started him on any medication and she feels that is helping. He is also taking allergy medication.  Hypertension: He is taking his medication daily. He is compliant with a low sodium diet.  He denies chest pain, palpitations, edema, shortness of breath and regular headaches. He is not exercising regularly.  He does not monitor his blood pressure at home.    Hyperlipidemia: He is taking his medication daily. He is compliant with a low fat/cholesterol diet. He is not exercising regularly. He denies myalgias.   Hypothyroidism:  He is taking his medication daily.  He denies any recent changes in energy or weight that are unexplained.   Anxiety: He takes the xanax once daily.  He has been on this medication for a long time and her been no side effects.  He is currently living with his neice.  He does have some memory issues-he does not know his date of birth. His niece is not sure of his exact diagnosis but there is some type of disability. He lives with his mother his entire life until she died and then lived with his sister. After his sister died he moved in with his knees. He is not able to live independently..     Medications and allergies reviewed with patient and updated if appropriate.  Patient Active Problem List   Diagnosis Date Noted  . Essential hypertension, benign 08/03/2016  . Hyperlipidemia 08/03/2016  . Hypothyroidism 08/03/2016  . History of prostate cancer 08/03/2016  . Overactive bladder 08/03/2016  . Intellectual disability 08/03/2016    . Anxiety 08/03/2016  . Chronic respiratory failure with hypoxia ([place]) 04/23/2016  . OSA (obstructive sleep apnea) 04/23/2016  . Upper airway cough syndrome 03/25/2016    Current Outpatient Prescriptions on File Prior to Visit  Medication Sig Dispense Refill  . ALPRAZolam (XANAX) 0.25 MG tablet Take 0.25 mg by mouth every 6 (six) hours as needed for anxiety.    Marland Kitchen aspirin 81 MG tablet Take 81 mg by mouth every morning.     . chlorpheniramine (CHLOR-TRIMETON) 4 MG tablet 2 at bedtime and 1 every 4 hours during the day as needed    . famotidine (PEPCID) 20 MG tablet One at bedtime    . levothyroxine (LEVOTHROID) 50 MCG tablet Take 50 mcg by mouth daily before breakfast.    . oxybutynin (DITROPAN) 5 MG tablet Take 5 mg by mouth 2 (two) times daily.     . pantoprazole (PROTONIX) 40 MG tablet Take 40 mg by mouth daily.    . pravastatin (PRAVACHOL) 80 MG tablet Take 80 mg by mouth at bedtime.     . tamsulosin (FLOMAX) 0.4 MG CAPS capsule Take 0.4 mg by mouth daily after supper.     . valsartan-hydrochlorothiazide (DIOVAN-HCT) 160-12.5 MG tablet Take 1 tablet by mouth every morning.      No current facility-administered medications on file prior to visit.     Past Medical History:  Diagnosis  Date  . Hyperlipidemia   . Hypertension   . Prostate cancer ([place])     No past surgical history on file.  Social History   Social History  . Marital status: Single    Spouse name: N/A  . Number of children: N/A  . Years of education: N/A   Social History Main Topics  . Smoking status: Never Smoker  . Smokeless tobacco: Former Systems developer    Types: Snuff    Quit date: 11/23/2013  . Alcohol use No  . Drug use: No  . Sexual activity: Not on file   Other Topics Concern  . Not on file   Social History Narrative  . No narrative on file    No family history on file.  Review of Systems  Constitutional: Negative for appetite change, chills and fever.  HENT: Negative for trouble swallowing.    Respiratory: Positive for cough (dry, improved; "tickle in throat"). Negative for shortness of breath and wheezing.   Cardiovascular: Negative for chest pain, palpitations and leg swelling.  Gastrointestinal: Negative for abdominal pain, blood in stool, constipation and diarrhea.  Genitourinary: Negative for dysuria and hematuria.  Musculoskeletal: Negative for arthralgias and back pain.  Neurological: Negative for dizziness, light-headedness and headaches.  Psychiatric/Behavioral: Negative for dysphoric mood and sleep disturbance. The patient is not nervous/anxious.        Objective:   Vitals:   08/03/16 1457  BP: 130/84  Pulse: 98  Resp: 16  Temp: 97.8 F (36.6 C)   Filed Weights   08/03/16 1457  Weight: 162 lb (73.5 kg)   Body mass index is 27.81 kg/m.   Physical Exam Constitutional: He appears well-developed and well-nourished. No distress.  HENT:  Head: Normocephalic and atraumatic.  Right Ear: External ear normal.  Left Ear: External ear normal.  Mouth/Throat: Oropharynx is clear and moist.  Normal ear canals and TM b/l  Eyes: Conjunctivae and EOM are normal.  Neck: Neck supple. No tracheal deviation present. No thyromegaly present.  No carotid bruit  Cardiovascular: Normal rate, regular rhythm, normal heart sounds and intact distal pulses.   No murmur heard. Pulmonary/Chest: Effort normal and breath sounds normal. No respiratory distress. He has no wheezes. He has no rales.  Abdominal: Soft. Bowel sounds are normal. He exhibits no distension. There is no tenderness.  Musculoskeletal: He exhibits no edema.  Lymphadenopathy:    He has no cervical adenopathy.  Skin: Skin is warm and dry. He is not diaphoretic.  Psychiatric: He has a normal mood and affect. His behavior is normal.         Assessment & Plan:   Flu vaccine today  See Problem List for Assessment and Plan of chronic medical problems.  Follow-up in 6 months

## 2016-08-03 NOTE — Assessment & Plan Note (Signed)
Following with urology

## 2016-08-03 NOTE — Assessment & Plan Note (Signed)
Taking pravastatin daily Will check lipid panel, CMP at his next visit

## 2016-08-03 NOTE — Assessment & Plan Note (Signed)
Not on cpap - not thought to be needed

## 2016-08-03 NOTE — Assessment & Plan Note (Signed)
Taking Xanax once daily No concerning side effects Continue once daily Xanax

## 2016-08-25 ENCOUNTER — Ambulatory Visit (INDEPENDENT_AMBULATORY_CARE_PROVIDER_SITE_OTHER): Payer: Medicare Other | Admitting: Adult Health

## 2016-08-25 ENCOUNTER — Encounter: Payer: Self-pay | Admitting: Adult Health

## 2016-08-25 VITALS — BP 118/74 | HR 67 | Temp 98.1°F | Ht 65.0 in | Wt 163.0 lb

## 2016-08-25 DIAGNOSIS — G4733 Obstructive sleep apnea (adult) (pediatric): Secondary | ICD-10-CM | POA: Diagnosis not present

## 2016-08-25 DIAGNOSIS — R05 Cough: Secondary | ICD-10-CM | POA: Diagnosis not present

## 2016-08-25 DIAGNOSIS — R058 Other specified cough: Secondary | ICD-10-CM

## 2016-08-25 NOTE — Patient Instructions (Signed)
Begin CPAP At bedtime  .  Wear for at least 4 hr each night.  Follow up in 6-8 weeks with download.  Continue on current regimen.

## 2016-08-25 NOTE — Assessment & Plan Note (Signed)
Begin CPAP At bedtime    Plan  Patient Instructions  Begin CPAP At bedtime  .  Wear for at least 4 hr each night.  Follow up in 6-8 weeks with download.  Continue on current regimen.

## 2016-08-25 NOTE — Progress Notes (Signed)
Chart and office note reviewed in detail  > agree with a/p as outlined    

## 2016-08-25 NOTE — Assessment & Plan Note (Signed)
Seems to be improving with treatment aimed at cough control , GERD.  If better on return look at tapering off neuontin.   Plan  Continue on current regimen.

## 2016-08-25 NOTE — Progress Notes (Signed)
Subjective:    Patient ID: Steven Matthews, male    DOB: 04-May-1931,    MRN: [id number]    Brief patient profile:  85 yobm ? Mentally retarded  never smoker referred to pulmonary clinic 03/24/2016 by Dr Rory Percy in [place] for cough onset late spring /summer 2016     History of Present Illness  03/25/2016 1st [name] office visit/ [facility]   Chief Complaint  Patient presents with  . Pulmonary Consult    Referred by Dr. Hayden Pedro. Pt c/o cough for the past year. Cough is non prod and occurs without any specific trigger.   onset was insidious pattern is persistent daily assoc with urge to clear throat but worse day than noct / sporadic and niece assures not related to eating or activity/ exposures. Per pulmonary notes neg resp to ppi,  singulair or zyrtec with neg CT chest and nl pfts done this past year and neg RAST dated 10/07/15  >>PPI rx . , Gabapentin 100 , chlor tabls     04/17/2016 NP Follow up :  Patient returns for a two-week follow-up for cough and medication review We reviewed all her medications organize them into a medication calendar with patient education.  Appears to be taking medications correctly except for taking protonix and omprazole and not pepcid.  Patient was seen last visit for pulmonary consult for cough. He was started on cough control regimen along with GERD tx.  Has had only minimal improvement in cough.  Could not tolerate tylenol #3 for cough .  rec May take Ompeprazole or Protonix daily before meal  Add Pepcid 20mg  At bedtime   May use Delsym 2 tsp Twice daily  As needed  Cough.     06/02/2016  f/u ov/[facility] re: chronic  cough / no med calendar on gaabpentin 100 tid/prilosec/pepcid  Chief Complaint  Patient presents with  . Follow-up    pt states cough is baseline since last OV. c/o cont non prod. no new concerns today.  noct cough is consistent w/in a few minutes of lying down but no excess/ purulent sputum or mucus plugs  Or hemoptysis No relation to  meals, sporadic during the day, no better with delsym  rec Add chlortabs x 2 and pepcid 20mg  at one hour before bedtime to see what the impact is Change omeprazole to take x 2 = 40 mg or 40 mg of pantoprazole before first meal Separating the top medications from the bottom group is fundamental to providing you adequate care going forward.   Please remember to go to the lab   department > Neg RAST  Next step sinus ct/ DgEs     07/14/2016  f/u ov/[facility] re: cough x at least spring 2016 when moved in with Niece / using med calendar  Chief Complaint  Patient presents with  . Follow-up    Cough is unchanged.   cough continues q hs and sporadic daytime/ dry  Not limited by breathing from desired activities   >>increase neurontin 300mg  Three times a day      08/25/2016 Follow up : Cough and OSA  Pt returns for 6 week follow up . Overall cough is getting better.  He has underwent extensive workup with normal PFT, neg RAST test. IgE was elevated at 172.  - Dg Es 07/20/2016 >>> Esophageal dysmotility with patulous GE junction and mild gastroesophageal reflux. These findings may predispose the patient to aspiration. No laryngeal penetration identified , he was referred to GI.  Accompanied  by his niece that he lives with. She takes care of him and helps with meds.  neurontin was increased last ov to help with cough control however . PCP decrease neurontin 100mg  Three times a day  .  The dose was too high for pt.   He did have ONO in past in [place] that showed desats . A home sleep study showed he had moderate OSA. He has not been started on CPAP  We discussed starting on CPAP At bedtime   They would like to proceed with CPAP .  Discussed a CPAP titration but niece does not think he could stay overnight without her.     Current Medications, Allergies, Complete Past Medical History, Past Surgical History, Family History, and Social History were reviewed in [employer]  record.  ROS  The following are not active complaints unless bolded sore throat, dysphagia, dental problems, itching, sneezing,  nasal congestion or excess/ purulent secretions, ear ache,   fever, chills, sweats, unintended wt loss, classically pleuritic or exertional cp, hemoptysis,  orthopnea pnd or leg swelling, presyncope, palpitations, abdominal pain, anorexia, nausea, vomiting, diarrhea  or change in bowel or bladder habits, change in stools or urine, dysuria,hematuria,  rash, arthralgias, visual complaints, headache, numbness, weakness or ataxia or problems with walking or coordination,  change in mood/affect or memory.            Objective:   Physical Exam  amb elderly nad , poor historian, most of information comes from niece.    Vitals:   08/25/16 1603  BP: 118/74  Pulse: 67  Temp: 98.1 F (36.7 C)  TempSrc: Oral  SpO2: 92%  Weight: 163 lb (73.9 kg)  Height: 5\' 5"  (1.651 m)   Vital signs reviewed    HEENT: nl   turbinates, and oropharynx. Nl external ear canals without cough reflex - very poor dentition   NECK :  without JVD/Nodes/TM/ nl carotid upstrokes bilaterally   LUNGS: no acc muscle use,  Nl contour chest which is clear to A and P bilaterally without cough on insp or exp maneuvers   CV:  RRR  no s3 or murmur or increase in P2, no edema   ABD:  soft and nontender with nl inspiratory excursion in the supine position. No bruits or organomegaly, bowel sounds nl  MS:  Nl gait/ ext warm without deformities, calf tenderness, cyanosis or clubbing No obvious joint restrictions   SKIN: warm and dry without lesions    NEURO:  alert, childlike , nl sensorium with  no motor deficits apparent       CXR PA and Lateral:   07/14/2016 :     There is no acute cardiopulmonary abnormality. Stable appearing calcified right upper lobe nodule consistent with previous granulomatous infection or hamartoma.     Tammy Parrett NP-C   Pulmonary and Critical Care    08/25/2016

## 2016-08-31 ENCOUNTER — Ambulatory Visit: Payer: Medicare Other | Admitting: Podiatry

## 2016-09-07 ENCOUNTER — Ambulatory Visit (INDEPENDENT_AMBULATORY_CARE_PROVIDER_SITE_OTHER): Payer: Medicare Other | Admitting: Podiatry

## 2016-09-07 ENCOUNTER — Encounter: Payer: Self-pay | Admitting: Podiatry

## 2016-09-07 VITALS — BP 158/72 | HR 67 | Resp 16 | Ht 65.0 in | Wt 150.0 lb

## 2016-09-07 DIAGNOSIS — B351 Tinea unguium: Secondary | ICD-10-CM | POA: Diagnosis not present

## 2016-09-07 DIAGNOSIS — M79674 Pain in right toe(s): Secondary | ICD-10-CM

## 2016-09-07 DIAGNOSIS — M79675 Pain in left toe(s): Secondary | ICD-10-CM | POA: Diagnosis not present

## 2016-09-07 NOTE — Progress Notes (Signed)
   Subjective:    Patient ID: Steven Matthews, male    DOB: Jun 18, 1931, 82 y.o.   MRN: [id number]  HPI 81 year old male presents the office today for concerns of thick, painful, elongated toenails that he cannot trim himself. He presents today with his niece would also discuss treatment options for toenail fungus. Denies any redness or drainage complaint toenail sites. Denies any open sores. No other complaints.   Review of Systems  All other systems reviewed and are negative.      Objective:   Physical Exam General: AAO x3, NAD  Dermatological: Nails are hypertrophic, dystrophic, brittle, discolored, elongated 10. The right hallux toenail is loose distally ubt firmly adhered proximally. No surrounding redness or drainage. Tenderness nails 1-5 bilaterally. No open lesions or pre-ulcerative lesions are identified today.  Vascular: Dorsalis Pedis artery and Posterior Tibial artery pedal pulses are 2/4 bilateral with immedate capillary fill time.  There is no pain with calf compression, swelling, warmth, erythema.   Neruologic: Sensation decreased with Derrel Nip monofilament, light touch intact.  Musculoskeletal: No gross boney pedal deformities bilateral. No pain, crepitus, or limitation noted with foot and ankle range of motion bilateral. Muscular strength 5/5 in all groups tested bilateral.  Gait: Unassisted, Nonantalgic.      Assessment & Plan:  82 year old male with onychomycosis -Treatment options discussed including all alternatives, risks, and complications -Etiology of symptoms were discussed -Nails debrided 10 without complications or bleeding. -Discussed treatment options for onychomycosis. They would proceed with treatment. Prescribed compound cream for onychomycosis. -Daily foot inspection -Follow-up in 3 months or sooner if any problems arise. In the meantime, encouraged to call the office with any questions, concerns, change in symptoms.   Celesta Gentile, DPM

## 2016-09-08 MED ORDER — NONFORMULARY OR COMPOUNDED ITEM: 2 refills | Status: DC

## 2016-09-08 NOTE — Addendum Note (Signed)
Addended by: Harriett Sine D on: 09/08/2016 10:17 AM   Modules accepted: Orders

## 2016-09-16 ENCOUNTER — Other Ambulatory Visit: Payer: Self-pay | Admitting: Internal Medicine

## 2016-09-17 NOTE — Telephone Encounter (Signed)
Ok to fill. Printed.

## 2016-09-17 NOTE — Telephone Encounter (Signed)
You have not filled RX for pt yet, please advise if okay to fill. Last OV 08/03/16

## 2016-09-17 NOTE — Telephone Encounter (Signed)
RX faxed to [facility] 

## 2016-10-12 ENCOUNTER — Encounter: Payer: Self-pay | Admitting: Adult Health

## 2016-10-12 ENCOUNTER — Ambulatory Visit (INDEPENDENT_AMBULATORY_CARE_PROVIDER_SITE_OTHER): Payer: Medicare Other | Admitting: Adult Health

## 2016-10-12 DIAGNOSIS — R05 Cough: Secondary | ICD-10-CM | POA: Diagnosis not present

## 2016-10-12 DIAGNOSIS — R058 Other specified cough: Secondary | ICD-10-CM

## 2016-10-12 DIAGNOSIS — G4733 Obstructive sleep apnea (adult) (pediatric): Secondary | ICD-10-CM

## 2016-10-12 NOTE — Patient Instructions (Signed)
Decrease Neurontin 100mg  Twice daily  For 1 week then .1 tab At bedtime  -hold at this dose until return  Delsym 2 tsp Twice daily  As needed  Cough .  Call back if you change your mind on CPAP .  Follow up Dr. Melvyn Novas  In 2 months and As needed

## 2016-10-12 NOTE — Progress Notes (Signed)
Subjective:    Patient ID: Steven Matthews, male    DOB: January 04, 1931,    MRN: [id number]    Brief patient profile:  85 yobm ? Mentally retarded  never smoker referred to pulmonary clinic 03/24/2016 by Dr Rory Percy in [place] for cough onset late spring /summer 2016     History of Present Illness  03/25/2016 1st [facility] office visit/ [facility]   Chief Complaint  Patient presents with  . Pulmonary Consult    Referred by Dr. Hayden Pedro. Pt c/o cough for the past year. Cough is non prod and occurs without any specific trigger.   onset was insidious pattern is persistent daily assoc with urge to clear throat but worse day than noct / sporadic and niece assures not related to eating or activity/ exposures. Per pulmonary notes neg resp to ppi,  singulair or zyrtec with neg CT chest and nl pfts done this past year and neg RAST dated 10/07/15  >>PPI rx . , Gabapentin 100 , chlor tabls     04/17/2016 NP Follow up :  Patient returns for a two-week follow-up for cough and medication review We reviewed all her medications organize them into a medication calendar with patient education.  Appears to be taking medications correctly except for taking protonix and omprazole and not pepcid.  Patient was seen last visit for pulmonary consult for cough. He was started on cough control regimen along with GERD tx.  Has had only minimal improvement in cough.  Could not tolerate tylenol #3 for cough .  rec May take Ompeprazole or Protonix daily before meal  Add Pepcid 20mg  At bedtime   May use Delsym 2 tsp Twice daily  As needed  Cough.     06/02/2016  f/u ov/[facility] re: chronic  cough / no med calendar on gaabpentin 100 tid/prilosec/pepcid  Chief Complaint  Patient presents with  . Follow-up    pt states cough is baseline since last OV. c/o cont non prod. no new concerns today.  noct cough is consistent w/in a few minutes of lying down but no excess/ purulent sputum or mucus plugs  Or hemoptysis No relation to  meals, sporadic during the day, no better with delsym  rec Add chlortabs x 2 and pepcid 20mg  at one hour before bedtime to see what the impact is Change omeprazole to take x 2 = 40 mg or 40 mg of pantoprazole before first meal Separating the top medications from the bottom group is fundamental to providing you adequate care going forward.   Please remember to go to the lab   department > Neg RAST  Next step sinus ct/ DgEs     07/14/2016  f/u ov/[facility] re: cough x at least spring 2016 when moved in with Niece / using med calendar  Chief Complaint  Patient presents with  . Follow-up    Cough is unchanged.   cough continues q hs and sporadic daytime/ dry  Not limited by breathing from desired activities   >>increase neurontin 300mg  Three times a day      08/25/2016 Follow up : Cough and OSA  Pt returns for 6 week follow up . Overall cough is getting better.  He has underwent extensive workup with normal PFT, neg RAST test. IgE was elevated at 172.  - Dg Es 07/20/2016 >>> Esophageal dysmotility with patulous GE junction and mild gastroesophageal reflux. These findings may predispose the patient to aspiration. No laryngeal penetration identified , he was referred to GI.  Accompanied  by his niece that he lives with. She takes care of him and helps with meds.  neurontin was increased last ov to help with cough control however . PCP decrease neurontin 100mg  Three times a day  .  The dose was too high for pt.   He did have ONO in past in [place] that showed desats . A home sleep study showed he had moderate OSA. He has not been started on CPAP  We discussed starting on CPAP At bedtime   They would like to proceed with CPAP .  Discussed a CPAP titration but niece does not think he could stay overnight without her.  >>CPAP   10/12/2016 Follow up ; OSA /cough  Pt returns for 6 month follow up . Started on CPAP last ov for moderate OSA.  Says he tried CPAP but absoutely can not wear it. It  frightens him. He has  some mental limitations and niece helps him . She says this will not work for him. Discussed different mask options but they are not interested.   Says cough is better , not as bad. Only comes and goes. Remains on neurontin 100mg  Twice daily  . Denies chest pain, orthopnea, edema or fever.    Current Medications, Allergies, Complete Past Medical History, Past Surgical History, Family History, and Social History were reviewed in [employer] record.  ROS  The following are not active complaints unless bolded sore throat, dysphagia, dental problems, itching, sneezing,  nasal congestion or excess/ purulent secretions, ear ache,   fever, chills, sweats, unintended wt loss, classically pleuritic or exertional cp, hemoptysis,  orthopnea pnd or leg swelling, presyncope, palpitations, abdominal pain, anorexia, nausea, vomiting, diarrhea  or change in bowel or bladder habits, change in stools or urine, dysuria,hematuria,  rash, arthralgias, visual complaints, headache, numbness, weakness or ataxia or problems with walking or coordination,  change in mood/affect or memory.            Objective:   Physical Exam  amb elderly nad , poor historian, most of information comes from niece.    Vitals:   10/12/16 1210  BP: 130/68  Pulse: 81  Temp: 97.8 F (36.6 C)  TempSrc: Oral  SpO2: 91%  Weight: 163 lb 12.8 oz (74.3 kg)  Height: 5\' 5"  (1.651 m)     Vital signs reviewed    HEENT: nl   turbinates, and oropharynx. Nl external ear canals without cough reflex - very poor dentition   NECK :  without JVD/Nodes/TM/ nl carotid upstrokes bilaterally   LUNGS: no acc muscle use,  Nl contour chest which is clear to A and P bilaterally without cough on insp or exp maneuvers   CV:  RRR  no s3 or murmur or increase in P2, no edema   ABD:  soft and nontender with nl inspiratory excursion in the supine position. No bruits or organomegaly, bowel sounds nl  MS:   Nl gait/ ext warm without deformities, calf tenderness, cyanosis or clubbing No obvious joint restrictions   SKIN: warm and dry without lesions    NEURO:  alert, childlike , nl sensorium with  no motor deficits apparent       CXR PA and Lateral:   07/14/2016 :     There is no acute cardiopulmonary abnormality. Stable appearing calcified right upper lobe nodule consistent with previous granulomatous infection or hamartoma.    Unita Detamore NP-C  [facility]  10/12/2016

## 2016-10-12 NOTE — Assessment & Plan Note (Signed)
Cough is improving .  Will decrease neurontin If doing well on return may be able to stop  Plan  Patient Instructions  Decrease Neurontin 100mg  Twice daily  For 1 week then .1 tab At bedtime  -hold at this dose until return  Delsym 2 tsp Twice daily  As needed  Cough .  Call back if you change your mind on CPAP .  Follow up Dr. Melvyn Novas  In 2 months and As needed

## 2016-10-12 NOTE — Assessment & Plan Note (Signed)
CPAP trial pt is intolerant and declines further treatment  Pt education given   Plan  Patient Instructions  Decrease Neurontin 100mg  Twice daily  For 1 week then .1 tab At bedtime  -hold at this dose until return  Delsym 2 tsp Twice daily  As needed  Cough .  Call back if you change your mind on CPAP .  Follow up Dr. Melvyn Novas  In 2 months and As needed

## 2016-10-12 NOTE — Progress Notes (Signed)
Chart and office note reviewed in detail  > agree with a/p as outlined    

## 2016-10-13 ENCOUNTER — Encounter: Payer: Self-pay | Admitting: Gastroenterology

## 2016-10-13 ENCOUNTER — Ambulatory Visit (INDEPENDENT_AMBULATORY_CARE_PROVIDER_SITE_OTHER): Payer: Medicare Other | Admitting: Gastroenterology

## 2016-10-13 VITALS — BP 140/80 | HR 60 | Ht 65.0 in | Wt 164.0 lb

## 2016-10-13 DIAGNOSIS — K219 Gastro-esophageal reflux disease without esophagitis: Secondary | ICD-10-CM

## 2016-10-13 DIAGNOSIS — R05 Cough: Secondary | ICD-10-CM

## 2016-10-13 DIAGNOSIS — R059 Cough, unspecified: Secondary | ICD-10-CM

## 2016-10-13 MED ORDER — PANTOPRAZOLE SODIUM 40 MG PO TBEC: 40.0000 mg | DELAYED_RELEASE_TABLET | Freq: Two times a day (BID) | ORAL | 3 refills | Status: DC

## 2016-10-13 MED ORDER — RANITIDINE HCL 300 MG PO TABS: 300.0000 mg | ORAL_TABLET | Freq: Every day | ORAL | 3 refills | Status: DC

## 2016-10-13 NOTE — Progress Notes (Signed)
Steven Matthews    [id number]    01-10-1931  Primary Care Physician:Stacy Lorretta Harp, MD  Referring Physician: Alvester Morin, MD No address on file  Chief complaint: Cough   HPI: 82 year old male here for evaluation of cough and esophageal dysmotility noted on barium esophagram. Patient reports worsening cough for the past 1 year, was evaluated by pulmonary and was started on anti-histamine and Delsym with some improvement, he also has obstructive sleep apnea. Cough started about a year or year and half ago, worse at bedtime when he lays down. Not related to eating or activity. He he underwent barium esophagram which showed a patulous GE junction and esophageal dysmotility along with mild gastroesophageal reflux. No definite of esophageal stricture mass or ulceration. Patient denies dysphagia, odynophagia, nausea, vomiting, melena or blood per rectum.  Outpatient Encounter Prescriptions as of 10/13/2016  Medication Sig  . ALPRAZolam (XANAX) 0.25 MG tablet TAKE 1 TABLET BY MOUTH EVERY 6 HOURS AS NEEDED  . aspirin 81 MG tablet Take 81 mg by mouth every morning.   . gabapentin (NEURONTIN) 100 MG capsule Take 1 capsule (100 mg total) by mouth 3 (three) times daily. (Patient taking differently: Take 100 mg by mouth 2 (two) times daily. )  . levothyroxine (LEVOTHROID) 50 MCG tablet Take 50 mcg by mouth daily before breakfast.  . NONFORMULARY OR COMPOUNDED ITEM [facility]:  Onychomycosis Nail Lacquer - fluconazole 2%, Terbinafine 1%, DMSO, apply daily to affected area.  Marland Kitchen oxybutynin (DITROPAN) 5 MG tablet Take 5 mg by mouth 2 (two) times daily.   . pantoprazole (PROTONIX) 40 MG tablet Take 40 mg by mouth daily.  . pravastatin (PRAVACHOL) 80 MG tablet Take 80 mg by mouth at bedtime.   . tamsulosin (FLOMAX) 0.4 MG CAPS capsule Take 0.4 mg by mouth daily after supper.   . valsartan-hydrochlorothiazide (DIOVAN-HCT) 160-12.5 MG tablet Take 1 tablet by mouth every morning.   .  [DISCONTINUED] chlorpheniramine (CHLOR-TRIMETON) 4 MG tablet 2 at bedtime and 1 every 4 hours during the day as needed  . [DISCONTINUED] famotidine (PEPCID) 20 MG tablet One at bedtime   No facility-administered encounter medications on file as of 10/13/2016.     Allergies as of 10/13/2016  . (No Known Allergies)    Past Medical History:  Diagnosis Date  . Hyperlipidemia   . Hypertension   . Prostate cancer [facility])     Past Surgical History:  Procedure Laterality Date  . None      Family History  Problem Relation Age of Onset  . Hypertension Mother   . Stroke Mother   . Colon cancer Neg Hx   . Esophageal cancer Neg Hx   . Stomach cancer Neg Hx   . Rectal cancer Neg Hx   . Liver cancer Neg Hx     Social History   Social History  . Marital status: Single    Spouse name: N/A  . Number of children: 0  . Years of education: N/A   Occupational History  . disabled    Social History Main Topics  . Smoking status: Never Smoker  . Smokeless tobacco: Former Systems developer    Types: Snuff    Quit date: 11/23/2013  . Alcohol use No  . Drug use: No  . Sexual activity: Not on file   Other Topics Concern  . Not on file   Social History Narrative  . No narrative on file      Review of  systems: Review of Systems  Constitutional: Negative for fever and chills.  HENT: Negative.   Eyes: Negative for blurred vision.  Respiratory: Negative shortness of breath and wheezing.   positive for cough Cardiovascular: Negative for chest pain and palpitations.  Gastrointestinal: as per HPI Genitourinary: Negative for dysuria, urgency, frequency and hematuria.  Musculoskeletal: Negative for myalgias, back pain and joint pain.  Skin: Negative for itching and rash.  Neurological: Negative for dizziness, tremors, focal weakness, seizures and loss of consciousness.  Endo/Heme/Allergies: Positive for seasonal allergies.  Psychiatric/Behavioral: Negative for depression, suicidal ideas and  hallucinations.  All other systems reviewed and are negative.   Physical Exam: Vitals:   10/13/16 1412  BP: 140/80  Pulse: 60   Body mass index is 27.29 kg/m. Gen:      No acute distress HEENT:  EOMI, sclera anicteric Neck:     No masses; no thyromegaly Lungs:    Clear to auscultation bilaterally; normal respiratory effort CV:         Regular rate and rhythm; no murmurs Abd:      + bowel sounds; soft, non-tender; no palpable masses, no distension Ext:    No edema; adequate peripheral perfusion Skin:      Warm and dry; no rash Neuro: alert and oriented x 3 Psych: normal mood and affect  Data Reviewed:  Reviewed labs, radiology imaging, old records and pertinent past GI work up   Assessment and Plan/Recommendations:  82 year old male here with complaints of cough for past 12-18 months with worsening symptoms at bedtime Patient's patient may likely have GERD and LPR provoking cough Protonix 40 mg twice daily, started patient to take it 30 minutes before breakfast and dinner Ranitidine 300 mg at bedtime as needed Discussed anti- reflux measures in detail Given his age will hold off endoscopic evaluation and barium esophagram was negative for any esophageal stricture mass or ulceration. Return as needed  25 minutes was spent face-to-face with the patient. Greater than 50% of the time used for counseling as well as treatment plan and follow-up. She had multiple questions which were answered to her satisfaction  K. Denzil Magnuson , MD [phone] Mon-Fri 8a-5p [phone] after 5p, weekends, holidays  CC: Alvester Morin, MD

## 2016-10-13 NOTE — Patient Instructions (Signed)
Increase your protonix to twice a day, we have sent in a new prescription to your pharmacy  We have sent Zantac to your pharmacy to take at bedtime   Food Choices for Gastroesophageal Reflux Disease, Adult When you have gastroesophageal reflux disease (GERD), the foods you eat and your eating habits are very important. Choosing the right foods can help ease your discomfort. What guidelines do I need to follow?  Choose fruits, vegetables, whole grains, and low-fat dairy products.  Choose low-fat meat, fish, and poultry.  Limit fats such as oils, salad dressings, butter, nuts, and avocado.  Keep a food diary. This helps you identify foods that cause symptoms.  Avoid foods that cause symptoms. These may be different for everyone.  Eat small meals often instead of 3 large meals a day.  Eat your meals slowly, in a place where you are relaxed.  Limit fried foods.  Cook foods using methods other than frying.  Avoid drinking alcohol.  Avoid drinking large amounts of liquids with your meals.  Avoid bending over or lying down until 2-3 hours after eating. What foods are not recommended? These are some foods and drinks that may make your symptoms worse: Vegetables  Tomatoes. Tomato juice. Tomato and spaghetti sauce. Chili peppers. Onion and garlic. Horseradish. Fruits  Oranges, grapefruit, and lemon (fruit and juice). Meats  High-fat meats, fish, and poultry. This includes hot dogs, ribs, ham, sausage, salami, and bacon. Dairy  Whole milk and chocolate milk. Sour cream. Cream. Butter. Ice cream. Cream cheese. Drinks  Coffee and tea. Bubbly (carbonated) drinks or energy drinks. Condiments  Hot sauce. Barbecue sauce. Sweets/Desserts  Chocolate and cocoa. Donuts. Peppermint and spearmint. Fats and Oils  High-fat foods. This includes [place] fries and potato chips. Other  Vinegar. Strong spices. This includes black pepper, white pepper, red pepper, cayenne, curry powder, cloves,  ginger, and chili powder. The items listed above may not be a complete list of foods and drinks to avoid. Contact your dietitian for more information.  This information is not intended to replace advice given to you by your health care provider. Make sure you discuss any questions you have with your health care provider. Document Released: 05/10/2012 Document Revised: 04/16/2016 Document Reviewed: 09/13/2013 Elsevier Interactive Patient Education  2017 [employer].   Follow up as needed

## 2016-11-01 ENCOUNTER — Other Ambulatory Visit: Payer: Self-pay | Admitting: Internal Medicine

## 2016-11-02 NOTE — Telephone Encounter (Signed)
RX faxed to [facility] 

## 2016-11-13 ENCOUNTER — Telehealth: Payer: Self-pay | Admitting: Internal Medicine

## 2016-11-13 NOTE — Telephone Encounter (Signed)
Rec'd from [facility] Medicine PA forward 52 pages to Dr. Quay Burow

## 2016-11-20 ENCOUNTER — Other Ambulatory Visit: Payer: Self-pay | Admitting: Internal Medicine

## 2016-11-20 NOTE — Telephone Encounter (Signed)
Please advise, you have never fill and we do not have TSH labs for pt.

## 2016-12-07 ENCOUNTER — Other Ambulatory Visit: Payer: Self-pay | Admitting: Internal Medicine

## 2016-12-08 NOTE — Telephone Encounter (Signed)
RX faxed to [facility] 

## 2016-12-14 ENCOUNTER — Encounter: Payer: Self-pay | Admitting: Internal Medicine

## 2016-12-14 ENCOUNTER — Ambulatory Visit (INDEPENDENT_AMBULATORY_CARE_PROVIDER_SITE_OTHER): Payer: Medicare Other | Admitting: Internal Medicine

## 2016-12-14 VITALS — BP 132/70 | HR 69 | Ht 65.0 in | Wt 162.2 lb

## 2016-12-14 DIAGNOSIS — R05 Cough: Secondary | ICD-10-CM

## 2016-12-14 DIAGNOSIS — R058 Other specified cough: Secondary | ICD-10-CM

## 2016-12-14 NOTE — Progress Notes (Signed)
Subjective:    Patient ID: Steven Matthews, male    DOB: 11/05/1931,    MRN: [id number]    Brief patient profile:  20 yobm Mentally retarded  never smoker referred to pulmonary clinic 03/24/2016 by Dr Rory Percy in [place] for cough onset late spring /summer 2016     History of Present Illness  03/25/2016 1st [facility] office visit/ [facility]   Chief Complaint  Patient presents with  . Pulmonary Consult    Referred by Dr. Hayden Pedro. Pt c/o cough for the past year. Cough is non prod and occurs without any specific trigger.   onset was insidious pattern is persistent daily assoc with urge to clear throat but worse day than noct / sporadic and niece assures not related to eating or activity/ exposures. Per pulmonary notes neg resp to ppi,  singulair or zyrtec with neg CT chest and nl pfts done this past year and neg RAST dated 10/07/15  >>PPI rx . , Gabapentin 100 , chlor tabls     04/17/2016 NP Follow up :  Patient returns for a two-week follow-up for cough and medication review We reviewed all her medications organize them into a medication calendar with patient education.  Appears to be taking medications correctly except for taking protonix and omprazole and not pepcid.  Patient was seen last visit for pulmonary consult for cough. He was started on cough control regimen along with GERD tx.  Has had only minimal improvement in cough.  Could not tolerate tylenol #3 for cough .  rec May take Ompeprazole or Protonix daily before meal  Add Pepcid 20mg  At bedtime   May use Delsym 2 tsp Twice daily  As needed  Cough.     06/02/2016  f/u ov/[facility] re: chronic  cough / no med calendar on gabapentin 100 tid/prilosec/pepcid  Chief Complaint  Patient presents with  . Follow-up    pt states cough is baseline since last OV. c/o cont non prod. no new concerns today.  noct cough is consistent w/in a few minutes of lying down but no excess/ purulent sputum or mucus plugs  Or hemoptysis No relation to  meals, sporadic during the day, no better with delsym  rec Add chlortabs x 2 and pepcid 20mg  at one hour before bedtime to see what the impact is Change omeprazole to take x 2 = 40 mg or 40 mg of pantoprazole before first meal Separating the top medications from the bottom group is fundamental to providing you adequate care going forward.   Please remember to go to the lab   department > Neg RAST  Next step sinus ct/ DgEs     07/14/2016  f/u ov/[facility] re: cough x at least spring 2016 when moved in with Niece / using med calendar  Chief Complaint  Patient presents with  . Follow-up    Cough is unchanged.   cough continues q hs and sporadic daytime/ dry  Not limited by breathing from desired activities   >>increase neurontin 300mg  Three times a day      08/25/2016 NP Follow up : Cough and OSA  Pt returns for 6 week follow up . Overall cough is getting better.  He has underwent extensive workup with normal PFT, neg RAST test. IgE was elevated at 172.  - Dg Es 07/20/2016 >>> Esophageal dysmotility with patulous GE junction and mild gastroesophageal reflux. These findings may predispose the patient to aspiration. No laryngeal penetration identified , he was referred to GI.  Accompanied  by his niece that he lives with. She takes care of him and helps with meds.  neurontin was increased last ov to help with cough control however . PCP decrease neurontin 100mg  Three times a day  .  The dose was too high for pt.   He did have ONO in past in [place] that showed desats . A home sleep study showed he had moderate OSA. He has not been started on CPAP  We discussed starting on CPAP At bedtime   They would like to proceed with CPAP .  Discussed a CPAP titration but niece does not think he could stay overnight without her.  >>CPAP   10/12/2016 NP  Follow up ; OSA /cough  Pt returns for 6 month follow up . Started on CPAP last ov for moderate OSA.  Says he tried CPAP but absoutely can not wear it. It  frightens him. He has  some mental limitations and niece helps him . She says this will not work for him. Discussed different mask options but they are not interested.  rec Decrease Neurontin 100mg  Twice daily  For 1 week then .1 tab At bedtime  -hold at this dose until return  Delsym 2 tsp Twice daily  As needed  Cough .  Call back if you change your mind on CPAP     12/14/2016  f/u ov/Wert re: cough x spring 2016  Chief Complaint  Patient presents with  . Follow-up    Pt stated his cough has improved, but is still present. No mucus productioin.   maint on gabapentin 100 tid and gerd rx and cough is better though niece reluctant to continue it  Not limited by breathing from desired activities    No obvious day to day or daytime variability or assoc excess/ purulent sputum or mucus plugs or hemoptysis or cp or chest tightness, subjective wheeze or overt sinus or hb symptoms. No unusual exp hx or h/o childhood pna/ asthma or knowledge of premature birth.  Sleeping ok without nocturnal  or early am exacerbation  of respiratory  c/o's or need for noct saba. Also denies any obvious fluctuation of symptoms with weather or environmental changes or other aggravating or alleviating factors except as outlined above   Current Medications, Allergies, Complete Past Medical History, Past Surgical History, Family History, and Social History were reviewed in [employer] record.  ROS  The following are not active complaints unless bolded sore throat, dysphagia, dental problems, itching, sneezing,  nasal congestion or excess/ purulent secretions, ear ache,   fever, chills, sweats, unintended wt loss, classically pleuritic or exertional cp,  orthopnea pnd or leg swelling, presyncope, palpitations, abdominal pain, anorexia, nausea, vomiting, diarrhea  or change in bowel or bladder habits, change in stools or urine, dysuria,hematuria,  rash, arthralgias, visual complaints, headache,  numbness, weakness or ataxia or problems with walking or coordination,  change in mood/affect or memory> chronic                 Objective:   Physical Exam  amb elderly nad , poor historian, most of information comes from niece.   Wt Readings from Last 3 Encounters:  12/14/16 162 lb 3.2 oz (73.6 kg)  10/13/16 164 lb (74.4 kg)  10/12/16 163 lb 12.8 oz (74.3 kg)     Vital signs reviewed - Note on arrival 02 sats  90% on RA     HEENT: nl   turbinates, and oropharynx. Nl external ear canals without cough  reflex - very poor dentition   NECK :  without JVD/Nodes/TM/ nl carotid upstrokes bilaterally   LUNGS: no acc muscle use,  Nl contour chest which is clear to A and P bilaterally without cough on insp or exp maneuvers   CV:  RRR  no s3 or murmur or increase in P2, no edema   ABD:  soft and nontender with nl inspiratory excursion in the supine position. No bruits or organomegaly, bowel sounds nl  MS:  Nl gait/ ext warm without deformities, calf tenderness, cyanosis or clubbing No obvious joint restrictions   SKIN: warm and dry without lesions    NEURO:  alert, childlike , nl sensorium with  no motor deficits apparent   Outpatient Encounter Prescriptions as of 12/14/2016  Medication Sig  . ALPRAZolam (XANAX) 0.25 MG tablet TAKE 1 TABLET BY MOUTH EVERY 6 HOURS AS NEEDED  . aspirin 81 MG tablet Take 81 mg by mouth every morning.   . gabapentin (NEURONTIN) 100 MG capsule Take 1 capsule (100 mg total) by mouth 3 (three) times daily. (Patient taking differently: Take 100 mg by mouth daily. )  . levothyroxine (SYNTHROID, LEVOTHROID) 50 MCG tablet TAKE 1 TABLET BY MOUTH EVERY DAY  . NONFORMULARY OR COMPOUNDED ITEM [facility]:  Onychomycosis Nail Lacquer - fluconazole 2%, Terbinafine 1%, DMSO, apply daily to affected area.  Marland Kitchen oxybutynin (DITROPAN) 5 MG tablet Take 5 mg by mouth 2 (two) times daily.   . pantoprazole (PROTONIX) 40 MG tablet Take 1 tablet (40 mg total) by mouth  2 (two) times daily. 30 minutes before breakfast and dinner  . pravastatin (PRAVACHOL) 80 MG tablet Take 80 mg by mouth at bedtime.   . ranitidine (ZANTAC) 300 MG tablet Take 1 tablet (300 mg total) by mouth at bedtime.  . tamsulosin (FLOMAX) 0.4 MG CAPS capsule Take 0.4 mg by mouth daily after supper.   . valsartan-hydrochlorothiazide (DIOVAN-HCT) 160-12.5 MG tablet Take 1 tablet by mouth every morning.    No facility-administered encounter medications on file as of 12/14/2016.

## 2016-12-14 NOTE — Patient Instructions (Signed)
protonix 40 mg Take 30- 60 min before your first and last meals of the day   Zantac 300 mg at bedtime   Try off gabapentin to see if cough worsens and if does get worse for any reason would rechallenge with 300 mg three times daily   Pulmonary follow up is as needed

## 2016-12-17 NOTE — Assessment & Plan Note (Signed)
PFTs 10/28/15 [place] > wnl Trial of max gerd/ H1/ diet started 03/25/2016 >>>  - 04/01/2016 no better so rec gabapentin 100 mg tid x 2 weeks trial then ov 04/17/2016 med calendar  - Allergy profile 06/02/2016 >  Eos 0.5 /  IgE  172 neg RAST -  07/14/2016 increase neurontin to 300 mg tid  - Dg Es 07/20/2016 >>> Esophageal dysmotility with patulous GE junction and mild gastroesophageal reflux. These findings may predispose the patient to aspiration. No laryngeal penetration identified > rec refer to GI > seen 10/13/16 by Nandigam> no new options, reinforced diet   Cough is better than baseline but only on 100 mg three times daily of gabapentin and niece wants him to try back off it which is fine with me but would not hesitate to restart and titrate back up to lowest level that works to control the cough  In the meantime needs to follow the acid suppression and diet regimen carefully   I had an extended summary  discussion with the patient/ niece eviewing all relevant studies (including details of gi w/u)  completed to date and  lasting 15 to 20 minutes of a 25 minute visit    Each maintenance medication was reviewed in detail including most importantly the difference between maintenance and prns and under what circumstances the prns are to be triggered using an action plan format that is not reflected in the computer generated alphabetically organized AVS.    Please see AVS for specific instructions unique to this visit that I personally wrote and verbalized to the the pt in detail and then reviewed with pt  by my nurse highlighting any  changes in therapy recommended at today's visit to their plan of care.

## 2017-01-04 ENCOUNTER — Other Ambulatory Visit: Payer: Self-pay | Admitting: Gastroenterology

## 2017-01-11 ENCOUNTER — Other Ambulatory Visit: Payer: Self-pay | Admitting: Internal Medicine

## 2017-01-13 ENCOUNTER — Other Ambulatory Visit: Payer: Self-pay | Admitting: Internal Medicine

## 2017-01-15 ENCOUNTER — Other Ambulatory Visit: Payer: Self-pay | Admitting: Emergency Medicine

## 2017-01-15 MED ORDER — PANTOPRAZOLE SODIUM 40 MG PO TBEC: 40.0000 mg | DELAYED_RELEASE_TABLET | Freq: Two times a day (BID) | ORAL | 0 refills | Status: DC

## 2017-02-01 ENCOUNTER — Ambulatory Visit: Payer: Medicare Other | Admitting: Internal Medicine

## 2017-02-01 NOTE — Progress Notes (Deleted)
Subjective:    Patient ID: Steven Matthews, male    DOB: [birth year], 83 y.o.   MRN: [id number]  HPI The patient is here for follow up.  Hypertension: He is taking his medication daily. He is compliant with a low sodium diet.  He denies chest pain, palpitations, edema, shortness of breath and regular headaches. He is exercising regularly.  He does not monitor his blood pressure at home.    Hyperlipidemia: He is taking his medication daily. He is compliant with a low fat/cholesterol diet. He is exercising regularly. He denies myalgias.   GERD:  He is taking his medication daily as prescribed.  He denies any GERD symptoms and feels his GERD is well controlled.   Hypothyroidism:  He is taking his medication daily.  He denies any recent changes in energy or weight that are unexplained.   Anxiety: He is taking his xanax daily as prescribed. He denies any side effects from the medication. He feels his anxiety is well controlled and he is happy with his current dose of medication.    Medications and allergies reviewed with patient and updated if appropriate.  Patient Active Problem List   Diagnosis Date Noted  . Essential hypertension, benign 08/03/2016  . Hyperlipidemia 08/03/2016  . Hypothyroidism 08/03/2016  . History of prostate cancer 08/03/2016  . Overactive bladder 08/03/2016  . Intellectual disability 08/03/2016  . Anxiety 08/03/2016  . Nail hypertrophy 08/03/2016  . Chronic respiratory failure with hypoxia ([place]) 04/23/2016  . OSA (obstructive sleep apnea) 04/23/2016  . Upper airway cough syndrome 03/25/2016    Current Outpatient Prescriptions on File Prior to Visit  Medication Sig Dispense Refill  . ALPRAZolam (XANAX) 0.25 MG tablet TAKE 1 TABLET BY MOUTH EVERY 6 HOURS AS NEEDED 40 tablet 0  . aspirin 81 MG tablet Take 81 mg by mouth every morning.     . gabapentin (NEURONTIN) 100 MG capsule Take 1 capsule (100 mg total) by mouth 3 (three) times daily. (Patient taking  differently: Take 100 mg by mouth daily. ) 90 capsule 5  . levothyroxine (SYNTHROID, LEVOTHROID) 50 MCG tablet TAKE 1 TABLET BY MOUTH EVERY DAY 90 tablet 0  . NONFORMULARY OR COMPOUNDED ITEM [facility]:  Onychomycosis Nail Lacquer - fluconazole 2%, Terbinafine 1%, DMSO, apply daily to affected area. 120 each 2  . oxybutynin (DITROPAN) 5 MG tablet Take 5 mg by mouth 2 (two) times daily.     . pantoprazole (PROTONIX) 40 MG tablet Take 1 tablet (40 mg total) by mouth 2 (two) times daily. 30 minutes before breakfast and dinner 180 tablet 0  . pravastatin (PRAVACHOL) 80 MG tablet Take 80 mg by mouth at bedtime.     . ranitidine (ZANTAC) 300 MG tablet TAKE 1 TABLET(300 MG) BY MOUTH AT BEDTIME 30 tablet 0  . tamsulosin (FLOMAX) 0.4 MG CAPS capsule Take 0.4 mg by mouth daily after supper.     . valsartan-hydrochlorothiazide (DIOVAN-HCT) 160-12.5 MG tablet TAKE 1 TABLET BY MOUTH EVERY DAY 30 tablet 2   No current facility-administered medications on file prior to visit.     Past Medical History:  Diagnosis Date  . Hyperlipidemia   . Hypertension   . Prostate cancer [facility])     Past Surgical History:  Procedure Laterality Date  . None      Social History   Social History  . Marital status: Single    Spouse name: N/A  . Number of children: 0  . Years of education: N/A  Occupational History  . disabled    Social History Main Topics  . Smoking status: Never Smoker  . Smokeless tobacco: Former Systems developer    Types: Snuff    Quit date: 11/23/2013  . Alcohol use No  . Drug use: No  . Sexual activity: Not on file   Other Topics Concern  . Not on file   Social History Narrative  . No narrative on file    Family History  Problem Relation Age of Onset  . Hypertension Mother   . Stroke Mother   . Colon cancer Neg Hx   . Esophageal cancer Neg Hx   . Stomach cancer Neg Hx   . Rectal cancer Neg Hx   . Liver cancer Neg Hx     Review of Systems     Objective:  There were no  vitals filed for this visit. Wt Readings from Last 3 Encounters:  12/14/16 162 lb 3.2 oz (73.6 kg)  10/13/16 164 lb (74.4 kg)  10/12/16 163 lb 12.8 oz (74.3 kg)   There is no height or weight on file to calculate BMI.   Physical Exam    Constitutional: Appears well-developed and well-nourished. No distress.  HENT:  Head: Normocephalic and atraumatic.  Neck: Neck supple. No tracheal deviation present. No thyromegaly present.  No cervical lymphadenopathy Cardiovascular: Normal rate, regular rhythm and normal heart sounds.   No murmur heard. No carotid bruit .  No edema Pulmonary/Chest: Effort normal and breath sounds normal. No respiratory distress. No has no wheezes. No rales.  Skin: Skin is warm and dry. Not diaphoretic.  Psychiatric: Normal mood and affect. Behavior is normal.      Assessment & Plan:    See Problem List for Assessment and Plan of chronic medical problems.

## 2017-02-14 NOTE — Patient Instructions (Addendum)
  Test(s) ordered today. Your results will be released to [place] (or called to you) after review, usually within 72hours after test completion. If any changes need to be made, you will be notified at that same time.  All other Health Maintenance issues reviewed.   All recommended immunizations and age-appropriate screenings are up-to-date or discussed.  No immunizations administered today.   Medications reviewed and updated.  Changes include stopping valsartan-hctz and starting hctz (hydrochlorothiazide) and amlodipine.   Monitor his BP at home - ideal is less than 140/90.  Your prescription(s) have been submitted to your pharmacy. Please take as directed and contact our office if you believe you are having problem(s) with the medication(s).  A referral was ordered for urology was ordered  Please followup in 6 months - wellness with nurse, follow up with me

## 2017-02-14 NOTE — Progress Notes (Signed)
Subjective:    Patient ID: Steven Matthews, male    DOB: 06/21/1931, 83 y.o.   MRN: [id number]  HPI The patient is here for follow up. He is here with his niece who provides the history.   Hypertension: He is taking his medication daily. He is compliant with a low sodium diet.  He denies chest pain, palpitations, edema, shortness of breath and regular headaches. He is not exercising regularly.  He does not monitor his blood pressure at home.    Hyperlipidemia: He is taking his medication daily. He is compliant with a low fat/cholesterol diet. He is not exercising regularly. He denies myalgias.   Anxiety: He is taking his anxiety once daily as prescribed. He denies any side effects from the medication. He feels his anxiety is well controlled and he is happy with his current dose of medication. His niece feels his anxiety is controlled.   GERD:  He is taking his medication daily as prescribed.  He denies any GERD symptoms and feels his GERD is well controlled.    h/o prostate cancer, overactive bladder:  He is following with urology.    He has lost 6 lbs since September without obvious changes in eating/activity.   He continues to have a dry cough. He has seen pulmonary and no obvious cause was found.  His niece is interested in trying to change is medication to see if one of them is causing the cough.  Medications and allergies reviewed with patient and updated if appropriate.  Patient Active Problem List   Diagnosis Date Noted  . Essential hypertension, benign 08/03/2016  . Hyperlipidemia 08/03/2016  . Hypothyroidism 08/03/2016  . History of prostate cancer 08/03/2016  . Overactive bladder 08/03/2016  . Intellectual disability 08/03/2016  . Anxiety 08/03/2016  . Nail hypertrophy 08/03/2016  . Chronic respiratory failure with hypoxia ([place]) 04/23/2016  . OSA (obstructive sleep apnea) 04/23/2016  . Upper airway cough syndrome 03/25/2016    Current Outpatient Prescriptions on File  Prior to Visit  Medication Sig Dispense Refill  . ALPRAZolam (XANAX) 0.25 MG tablet TAKE 1 TABLET BY MOUTH EVERY 6 HOURS AS NEEDED 40 tablet 0  . aspirin 81 MG tablet Take 81 mg by mouth every morning.     Marland Kitchen levothyroxine (SYNTHROID, LEVOTHROID) 50 MCG tablet TAKE 1 TABLET BY MOUTH EVERY DAY 90 tablet 0  . NONFORMULARY OR COMPOUNDED ITEM [facility]:  Onychomycosis Nail Lacquer - fluconazole 2%, Terbinafine 1%, DMSO, apply daily to affected area. 120 each 2  . oxybutynin (DITROPAN) 5 MG tablet Take 5 mg by mouth 2 (two) times daily.     . pantoprazole (PROTONIX) 40 MG tablet Take 1 tablet (40 mg total) by mouth 2 (two) times daily. 30 minutes before breakfast and dinner 180 tablet 0  . pravastatin (PRAVACHOL) 80 MG tablet Take 80 mg by mouth at bedtime.     . ranitidine (ZANTAC) 300 MG tablet TAKE 1 TABLET(300 MG) BY MOUTH AT BEDTIME 30 tablet 0  . tamsulosin (FLOMAX) 0.4 MG CAPS capsule Take 0.4 mg by mouth daily after supper.     . valsartan-hydrochlorothiazide (DIOVAN-HCT) 160-12.5 MG tablet TAKE 1 TABLET BY MOUTH EVERY DAY 30 tablet 2   No current facility-administered medications on file prior to visit.     Past Medical History:  Diagnosis Date  . Hyperlipidemia   . Hypertension   . Prostate cancer [facility])     Past Surgical History:  Procedure Laterality Date  . None  Social History   Social History  . Marital status: Single    Spouse name: N/A  . Number of children: 0  . Years of education: N/A   Occupational History  . disabled    Social History Main Topics  . Smoking status: Never Smoker  . Smokeless tobacco: Former Systems developer    Types: Snuff    Quit date: 11/23/2013  . Alcohol use No  . Drug use: No  . Sexual activity: Not on file   Other Topics Concern  . Not on file   Social History Narrative  . No narrative on file    Family History  Problem Relation Age of Onset  . Hypertension Mother   . Stroke Mother   . Colon cancer Neg Hx   . Esophageal  cancer Neg Hx   . Stomach cancer Neg Hx   . Rectal cancer Neg Hx   . Liver cancer Neg Hx     Review of Systems  Constitutional: Negative for appetite change, chills and fever.  Respiratory: Positive for cough. Negative for shortness of breath and wheezing.   Cardiovascular: Negative for chest pain, palpitations and leg swelling.  Gastrointestinal: Negative for abdominal pain, constipation and diarrhea.  Genitourinary: Negative for dysuria and hematuria.  Neurological: Negative for dizziness, light-headedness and headaches.  Psychiatric/Behavioral: Negative for sleep disturbance.       Objective:   Vitals:   02/15/17 1049  BP: (!) 152/86  Pulse: 78  Resp: 16  Temp: 97.7 F (36.5 C)   Wt Readings from Last 3 Encounters:  02/15/17 156 lb (70.8 kg)  12/14/16 162 lb 3.2 oz (73.6 kg)  10/13/16 164 lb (74.4 kg)   Body mass index is 25.96 kg/m.   Physical Exam    Constitutional: Appears well-developed and well-nourished. No distress.  HENT:  Head: Normocephalic and atraumatic.  Neck: Neck supple. No tracheal deviation present. No thyromegaly present.  No cervical lymphadenopathy Cardiovascular: Normal rate, regular rhythm and normal heart sounds.   No murmur heard. No carotid bruit .  No edema Pulmonary/Chest: Effort normal and breath sounds normal. No respiratory distress. No has no wheezes. No rales.  Abdomen: soft, nontender non distended Skin: Skin is warm and dry. Not diaphoretic.  Psychiatric: Normal mood and affect. Behavior is normal.      Assessment & Plan:    See Problem List for Assessment and Plan of chronic medical problems.

## 2017-02-15 ENCOUNTER — Ambulatory Visit (INDEPENDENT_AMBULATORY_CARE_PROVIDER_SITE_OTHER): Payer: Medicare Other | Admitting: Internal Medicine

## 2017-02-15 ENCOUNTER — Encounter: Payer: Self-pay | Admitting: Internal Medicine

## 2017-02-15 ENCOUNTER — Other Ambulatory Visit (INDEPENDENT_AMBULATORY_CARE_PROVIDER_SITE_OTHER): Payer: Medicare Other

## 2017-02-15 VITALS — BP 152/86 | HR 78 | Temp 97.7°F | Resp 16 | Wt 156.0 lb

## 2017-02-15 DIAGNOSIS — Z8546 Personal history of malignant neoplasm of prostate: Secondary | ICD-10-CM | POA: Diagnosis not present

## 2017-02-15 DIAGNOSIS — E039 Hypothyroidism, unspecified: Secondary | ICD-10-CM

## 2017-02-15 DIAGNOSIS — E7800 Pure hypercholesterolemia, unspecified: Secondary | ICD-10-CM

## 2017-02-15 DIAGNOSIS — K219 Gastro-esophageal reflux disease without esophagitis: Secondary | ICD-10-CM | POA: Diagnosis not present

## 2017-02-15 DIAGNOSIS — I10 Essential (primary) hypertension: Secondary | ICD-10-CM

## 2017-02-15 DIAGNOSIS — F419 Anxiety disorder, unspecified: Secondary | ICD-10-CM | POA: Diagnosis not present

## 2017-02-15 LAB — LIPID PANEL
CHOLESTEROL: 143 mg/dL (ref 0–200)
HDL: 40.9 mg/dL (ref >39.00)
LDL CALC: 74 mg/dL (ref 0–99)
NonHDL: 102.53
TRIGLYCERIDES: 141 mg/dL (ref 0.0–149.0)
Total CHOL/HDL Ratio: 4
VLDL: 28.2 mg/dL (ref 0.0–40.0)

## 2017-02-15 LAB — CBC WITH DIFFERENTIAL/PLATELET
BASOS PCT: 0.9 % (ref 0.0–3.0)
Basophils Absolute: 0 10*3/uL (ref 0.0–0.1)
EOS PCT: 2.3 % (ref 0.0–5.0)
Eosinophils Absolute: 0.1 10*3/uL (ref 0.0–0.7)
HEMATOCRIT: 41.5 % (ref 39.0–52.0)
HEMOGLOBIN: 13.6 g/dL (ref 13.0–17.0)
LYMPHS PCT: 17.3 % (ref 12.0–46.0)
Lymphs Abs: 0.9 10*3/uL (ref 0.7–4.0)
MCHC: 32.9 g/dL (ref 30.0–36.0)
MCV: 87.2 fl (ref 78.0–100.0)
MONOS PCT: 8.1 % (ref 3.0–12.0)
Monocytes Absolute: 0.4 10*3/uL (ref 0.1–1.0)
NEUTROS ABS: 3.6 10*3/uL (ref 1.4–7.7)
Neutrophils Relative %: 71.4 % (ref 43.0–77.0)
Platelets: 226 10*3/uL (ref 150.0–400.0)
RBC: 4.76 Mil/uL (ref 4.22–5.81)
RDW: 14.7 % (ref 11.5–15.5)
WBC: 5.1 10*3/uL (ref 4.0–10.5)

## 2017-02-15 LAB — COMPREHENSIVE METABOLIC PANEL
ALBUMIN: 4.4 g/dL (ref 3.5–5.2)
ALK PHOS: 60 U/L (ref 39–117)
ALT: 14 U/L (ref 0–53)
AST: 18 U/L (ref 0–37)
BILIRUBIN TOTAL: 0.7 mg/dL (ref 0.2–1.2)
BUN: 24 mg/dL — ABNORMAL HIGH (ref 6–23)
CALCIUM: 9.6 mg/dL (ref 8.4–10.5)
CO2: 28 meq/L (ref 19–32)
Chloride: 104 mEq/L (ref 96–112)
Creatinine, Ser: 1.66 mg/dL — ABNORMAL HIGH (ref 0.40–1.50)
GFR: 51.01 mL/min — AB (ref >60.00)
Glucose, Bld: 110 mg/dL — ABNORMAL HIGH (ref 70–99)
Potassium: 4 mEq/L (ref 3.5–5.1)
Sodium: 141 mEq/L (ref 135–145)
Total Protein: 8.4 g/dL — ABNORMAL HIGH (ref 6.0–8.3)

## 2017-02-15 LAB — TSH: TSH: 4.22 u[IU]/mL (ref 0.35–4.50)

## 2017-02-15 MED ORDER — TAMSULOSIN HCL 0.4 MG PO CAPS: 0.4000 mg | ORAL_CAPSULE | Freq: Every day | ORAL | 0 refills | Status: DC

## 2017-02-15 MED ORDER — ALPRAZOLAM 0.25 MG PO TABS: 0.2500 mg | ORAL_TABLET | Freq: Four times a day (QID) | ORAL | 0 refills | Status: DC | PRN

## 2017-02-15 MED ORDER — AMLODIPINE BESYLATE 10 MG PO TABS: 10.0000 mg | ORAL_TABLET | Freq: Every day | ORAL | 3 refills | Status: DC

## 2017-02-15 MED ORDER — LEVOTHYROXINE SODIUM 50 MCG PO TABS: 50.0000 ug | ORAL_TABLET | Freq: Every day | ORAL | 3 refills | Status: DC

## 2017-02-15 MED ORDER — HYDROCHLOROTHIAZIDE 12.5 MG PO TABS: 12.5000 mg | ORAL_TABLET | Freq: Every day | ORAL | 3 refills | Status: DC

## 2017-02-15 NOTE — Progress Notes (Signed)
Pre visit review using our clinic review tool, if applicable. No additional management support is needed unless otherwise documented below in the visit note. 

## 2017-02-15 NOTE — Assessment & Plan Note (Signed)
Check tsh  Titrate med dose if needed  

## 2017-02-15 NOTE — Assessment & Plan Note (Signed)
Needs a new urologist in the area Will refer

## 2017-02-15 NOTE — Assessment & Plan Note (Signed)
Check lipid panel  Continue daily statin  healthy diet encouraged  

## 2017-02-15 NOTE — Assessment & Plan Note (Signed)
BP slightly elevated today His niece will start to monitor Will d/c diovan to see if that is causing the cough Continue hctz 12. 5 mg daily Start amlodipine 10 mg daily - discussed possible side effects cmp, tsh

## 2017-02-15 NOTE — Assessment & Plan Note (Signed)
Taking xanax daily Anxiety controlled Will continue Refilled today

## 2017-02-15 NOTE — Assessment & Plan Note (Signed)
GERD controlled Continue daily medication  

## 2017-03-18 ENCOUNTER — Other Ambulatory Visit: Payer: Self-pay | Admitting: Gastroenterology

## 2017-03-22 ENCOUNTER — Encounter: Payer: Self-pay | Admitting: Internal Medicine

## 2017-03-22 MED ORDER — OXYBUTYNIN CHLORIDE 5 MG PO TABS: 5.0000 mg | ORAL_TABLET | Freq: Two times a day (BID) | ORAL | 1 refills | Status: DC

## 2017-03-22 MED ORDER — PRAVASTATIN SODIUM 80 MG PO TABS
80.0000 mg | ORAL_TABLET | Freq: Every day | ORAL | 1 refills | Status: DC
Start: 2017-03-22 — End: 2017-09-11

## 2017-04-12 ENCOUNTER — Other Ambulatory Visit: Payer: Self-pay | Admitting: Internal Medicine

## 2017-04-12 DIAGNOSIS — C61 Malignant neoplasm of prostate: Secondary | ICD-10-CM | POA: Diagnosis not present

## 2017-04-12 DIAGNOSIS — R350 Frequency of micturition: Secondary | ICD-10-CM | POA: Diagnosis not present

## 2017-04-12 NOTE — Telephone Encounter (Signed)
[place] Controlled Substance Database checked. Okay to fill RX. Last filled 02/15/17. RX faxed to [facility]

## 2017-04-18 ENCOUNTER — Other Ambulatory Visit: Payer: Self-pay | Admitting: Internal Medicine

## 2017-05-13 ENCOUNTER — Other Ambulatory Visit: Payer: Self-pay | Admitting: Internal Medicine

## 2017-05-14 ENCOUNTER — Other Ambulatory Visit: Payer: Self-pay | Admitting: Internal Medicine

## 2017-05-17 NOTE — Telephone Encounter (Signed)
[place] Controlled Substance Database checked. Okay to fill RX. Last filled on 04/12/17 Faxed to [facility]

## 2017-05-30 ENCOUNTER — Other Ambulatory Visit: Payer: Self-pay | Admitting: Internal Medicine

## 2017-06-06 ENCOUNTER — Other Ambulatory Visit: Payer: Self-pay | Admitting: Internal Medicine

## 2017-06-24 ENCOUNTER — Other Ambulatory Visit: Payer: Self-pay | Admitting: Internal Medicine

## 2017-06-25 NOTE — Telephone Encounter (Signed)
RX faxed to [facility] 

## 2017-06-25 NOTE — Telephone Encounter (Signed)
[place] Controlled Substance Database checked. Last filled 05/17/17.

## 2017-07-16 ENCOUNTER — Ambulatory Visit (INDEPENDENT_AMBULATORY_CARE_PROVIDER_SITE_OTHER): Payer: Medicare Other | Admitting: Internal Medicine

## 2017-07-16 ENCOUNTER — Encounter: Payer: Self-pay | Admitting: Internal Medicine

## 2017-07-16 VITALS — BP 134/62 | HR 79 | Temp 98.5°F | Resp 16 | Wt 164.0 lb

## 2017-07-16 DIAGNOSIS — R062 Wheezing: Secondary | ICD-10-CM | POA: Diagnosis not present

## 2017-07-16 DIAGNOSIS — J209 Acute bronchitis, unspecified: Secondary | ICD-10-CM | POA: Insufficient documentation

## 2017-07-16 MED ORDER — CEFDINIR 300 MG PO CAPS: 300.0000 mg | ORAL_CAPSULE | Freq: Two times a day (BID) | ORAL | 0 refills | Status: DC

## 2017-07-16 NOTE — Patient Instructions (Addendum)
An antibiotic was prescribed, Cefdinir - take it twice daily for 10 days.  If there is no improvement or any worsening by Monday please let me know    Continue the over the counter cold medications.

## 2017-07-16 NOTE — Assessment & Plan Note (Signed)
Symptoms consistent with bronchitis - concern for bacterial cause Has wheezing on exam Will start omnicef x 10 days Continue otc cold meds Call if no improvement

## 2017-07-16 NOTE — Progress Notes (Signed)
Subjective:    Patient ID: Steven Matthews, male    DOB: 1931-10-28, 83 y.o.   MRN: [id number]  HPI He is here for an acute visit for cold symptoms.  His niece provides most of the history.   His symptoms started a couple of days ago.    He is experiencing low grade fever, wet sounding cough, wheeze, and nasal congestion.  He does not complain much, but denies other symptoms.  He and his niece denies SOB. He denies sinus pain, sore throat, ear pain and headaches.    He has tried taking robitussin, alka seltzer plus.     Medications and allergies reviewed with patient and updated if appropriate.  Patient Active Problem List   Diagnosis Date Noted  . GERD (gastroesophageal reflux disease) 02/15/2017  . Essential hypertension, benign 08/03/2016  . Hyperlipidemia 08/03/2016  . Hypothyroidism 08/03/2016  . History of prostate cancer 08/03/2016  . Overactive bladder 08/03/2016  . Intellectual disability 08/03/2016  . Anxiety 08/03/2016  . Nail hypertrophy 08/03/2016  . Chronic respiratory failure with hypoxia ([place]) 04/23/2016  . OSA (obstructive sleep apnea) 04/23/2016  . Upper airway cough syndrome 03/25/2016    Current Outpatient Prescriptions on File Prior to Visit  Medication Sig Dispense Refill  . ALPRAZolam (XANAX) 0.25 MG tablet TAKE 1 TABLET BY MOUTH EVERY 6 HOURS AS NEEDED 40 tablet 0  . amLODipine (NORVASC) 10 MG tablet TAKE 1 TABLET(10 MG) BY MOUTH DAILY 30 tablet 2  . aspirin 81 MG tablet Take 81 mg by mouth every morning.     . hydrochlorothiazide (HYDRODIURIL) 12.5 MG tablet TAKE 1 TABLET(12.5 MG) BY MOUTH DAILY 30 tablet 2  . levothyroxine (SYNTHROID, LEVOTHROID) 50 MCG tablet Take 1 tablet (50 mcg total) by mouth daily. 90 tablet 3  . NONFORMULARY OR COMPOUNDED ITEM [facility]:  Onychomycosis Nail Lacquer - fluconazole 2%, Terbinafine 1%, DMSO, apply daily to affected area. 120 each 2  . pantoprazole (PROTONIX) 40 MG tablet TAKE 1 TABLET BY MOUTH TWICE DAILY  30 MINUTES BEFORE BREAKFAST AND DINNER 180 tablet 3  . pravastatin (PRAVACHOL) 80 MG tablet Take 1 tablet (80 mg total) by mouth at bedtime. 90 tablet 1  . ranitidine (ZANTAC) 300 MG tablet TAKE 1 TABLET(300 MG) BY MOUTH AT BEDTIME 90 tablet 3  . tamsulosin (FLOMAX) 0.4 MG CAPS capsule TAKE 1 CAPSULE(0.4 MG) BY MOUTH DAILY AFTER SUPPER 90 capsule 0   No current facility-administered medications on file prior to visit.     Past Medical History:  Diagnosis Date  . Hyperlipidemia   . Hypertension   . Prostate cancer [facility])     Past Surgical History:  Procedure Laterality Date  . None      Social History   Social History  . Marital status: Single    Spouse name: N/A  . Number of children: 0  . Years of education: N/A   Occupational History  . disabled    Social History Main Topics  . Smoking status: Never Smoker  . Smokeless tobacco: Former Systems developer    Types: Snuff    Quit date: 11/23/2013  . Alcohol use No  . Drug use: No  . Sexual activity: Not on file   Other Topics Concern  . Not on file   Social History Narrative  . No narrative on file    Family History  Problem Relation Age of Onset  . Hypertension Mother   . Stroke Mother   . Colon cancer Neg Hx   .  Esophageal cancer Neg Hx   . Stomach cancer Neg Hx   . Rectal cancer Neg Hx   . Liver cancer Neg Hx     Review of Systems  Constitutional: Positive for fever (low grade). Negative for appetite change and chills.  HENT: Positive for congestion. Negative for ear pain, sinus pain, sinus pressure and sore throat.   Respiratory: Positive for cough (wet cough) and wheezing. Negative for shortness of breath.   Gastrointestinal: Negative for abdominal pain, diarrhea and nausea.  Musculoskeletal: Negative for myalgias.  Neurological: Negative for dizziness, light-headedness and headaches.       Objective:   Vitals:   07/16/17 1543  BP: 134/62  Pulse: 79  Resp: 16  Temp: 98.5 F (36.9 C)  SpO2: 93%   Filed  Weights   07/16/17 1543  Weight: 164 lb (74.4 kg)   Body mass index is 27.29 kg/m.  Wt Readings from Last 3 Encounters:  07/16/17 164 lb (74.4 kg)  02/15/17 156 lb (70.8 kg)  12/14/16 162 lb 3.2 oz (73.6 kg)     Physical Exam GENERAL APPEARANCE: Appears stated age, well appearing, NAD EYES: conjunctiva clear, no icterus HEENT: bilateral tympanic membranes and ear canals normal, oropharynx with no erythema, no thyromegaly, trachea midline, no cervical or supraclavicular lymphadenopathy LUNGS: Unlabored breathing, good air entry bilaterally, b/l wheeze with expiration, no crackles HEART: Normal S1,S2 without murmurs EXTREMITIES: Without clubbing, cyanosis, or edema        Assessment & Plan:   See Problem List for Assessment and Plan of chronic medical problems.

## 2017-07-16 NOTE — Assessment & Plan Note (Signed)
Reactive airway to acute bronchitis He is a poor historian and he does not complain much - it is hard to know how bad his symptoms are affecting him omnicef x 10 days otc cold meds  if no improvement or any worsening will add a steroid taper

## 2017-08-07 ENCOUNTER — Other Ambulatory Visit: Payer: Self-pay | Admitting: Internal Medicine

## 2017-08-09 ENCOUNTER — Other Ambulatory Visit: Payer: Self-pay | Admitting: Internal Medicine

## 2017-08-09 NOTE — Telephone Encounter (Signed)
Ok to fill. printed 

## 2017-08-09 NOTE — Telephone Encounter (Signed)
[place] Controlled Substance Database checked. Last filled on 06/25/17

## 2017-08-10 NOTE — Telephone Encounter (Signed)
RX faxed to [facility] 

## 2017-08-13 NOTE — Progress Notes (Signed)
Pre visit review using our clinic review tool, if applicable. No additional management support is needed unless otherwise documented below in the visit note. 

## 2017-08-13 NOTE — Progress Notes (Signed)
Subjective:   Steven Matthews is a 83 y.o. male who presents for an Initial Medicare Annual Wellness Visit.  Review of Systems  No ROS.  Medicare Wellness Visit. Additional risk factors are reflected in the social history.  Cardiac Risk Factors include: advanced age (>42men, >9 women);dyslipidemia;hypertension;male gender Sleep patterns: feels rested on waking, gets up 2 times nightly to void and sleeps 7-8 hours nightly.    Home Safety/Smoke Alarms: Feels safe in home. Smoke alarms in place.  Living environment; residence and Firearm Safety: 1-story house/ trailer, no firearms. Lives with niece, no needs for DME, good support system Seat Belt Safety/Bike Helmet: Wears seat belt.    Objective:    Today's Vitals   08/16/17 0932  BP: (!) 142/86  Pulse: 83  Temp: 98 F (36.7 C)  TempSrc: Oral  SpO2: 98%  Weight: 159 lb (72.1 kg)  Height: 5\' 5"  (1.651 m)   Body mass index is 26.46 kg/m.  Current Medications (verified) Outpatient Encounter Prescriptions as of 08/16/2017  Medication Sig  . ALPRAZolam (XANAX) 0.25 MG tablet TAKE 1 TABLET BY MOUTH EVERY 6 HOURS AS NEEDED  . amLODipine (NORVASC) 10 MG tablet TAKE 1 TABLET(10 MG) BY MOUTH DAILY  . aspirin 81 MG tablet Take 81 mg by mouth every morning.   . hydrochlorothiazide (HYDRODIURIL) 12.5 MG tablet TAKE 1 TABLET(12.5 MG) BY MOUTH DAILY  . levothyroxine (SYNTHROID, LEVOTHROID) 50 MCG tablet Take 1 tablet (50 mcg total) by mouth daily.  . NONFORMULARY OR COMPOUNDED ITEM [facility]:  Onychomycosis Nail Lacquer - fluconazole 2%, Terbinafine 1%, DMSO, apply daily to affected area.  . pantoprazole (PROTONIX) 40 MG tablet TAKE 1 TABLET BY MOUTH TWICE DAILY 30 MINUTES BEFORE BREAKFAST AND DINNER  . pravastatin (PRAVACHOL) 80 MG tablet Take 1 tablet (80 mg total) by mouth at bedtime.  . ranitidine (ZANTAC) 300 MG tablet TAKE 1 TABLET(300 MG) BY MOUTH AT BEDTIME  . tamsulosin (FLOMAX) 0.4 MG CAPS capsule TAKE 1 CAPSULE(0.4 MG) BY  MOUTH DAILY AFTER SUPPER  . [DISCONTINUED] cefdinir (OMNICEF) 300 MG capsule Take 1 capsule (300 mg total) by mouth 2 (two) times daily.   No facility-administered encounter medications on file as of 08/16/2017.     Allergies (verified) Patient has no known allergies.   History: Past Medical History:  Diagnosis Date  . Hyperlipidemia   . Hypertension   . Prostate cancer [facility])    Past Surgical History:  Procedure Laterality Date  . None     Family History  Problem Relation Age of Onset  . Hypertension Mother   . Stroke Mother   . Colon cancer Neg Hx   . Esophageal cancer Neg Hx   . Stomach cancer Neg Hx   . Rectal cancer Neg Hx   . Liver cancer Neg Hx    Social History   Occupational History  . disabled    Social History Main Topics  . Smoking status: Never Smoker  . Smokeless tobacco: Former Systems developer    Types: Snuff    Quit date: 11/23/2013  . Alcohol use No  . Drug use: No  . Sexual activity: Not on file   Tobacco Counseling Counseling given: Not Answered   Activities of Daily Living In your present state of health, do you have any difficulty performing the following activities: 08/16/2017  Hearing? Y  Vision? N  Difficulty concentrating or making decisions? Y  Walking or climbing stairs? N  Dressing or bathing? N  Doing errands, shopping? Y  Conservation officer, nature  and eating ? Y  Using the Toilet? N  In the past six months, have you accidently leaked urine? N  Do you have problems with loss of bowel control? N  Managing your Medications? Y  Managing your Finances? Y  Housekeeping or managing your Housekeeping? Y  Some recent data might be hidden    Immunizations and Health Maintenance Immunization History  Administered Date(s) Administered  . Influenza, High Dose Seasonal PF 08/03/2016, 08/16/2017  . Influenza,inj,Quad PF,6+ Mos 10/18/2015  . Pneumococcal Polysaccharide-23 06/09/2006   Health Maintenance Due  Topic Date Due  . TETANUS/TDAP  01/31/1952  .  PNA vac Low Risk Adult (2 of 2 - PCV13) 06/10/2007    Patient Care Team: Binnie Rail, MD as PCP - General (Internal Medicine) Trula Slade, DPM as Consulting Physician (Podiatry) Tanda Rockers, MD as Consulting Physician (Pulmonary Disease) Mauri Pole, MD as Consulting Physician (Gastroenterology)   Indicate any recent Medical Services you may have received from other than [facility] providers in the past year (date may be approximate).    Assessment:   This is a routine wellness examination for Steven Matthews. Physical assessment deferred to PCP.  Hearing/Vision screen Hearing Screening Comments: Able to hear conversational tones w/o difficulty. No issues reported.  Passed whisper test Vision Screening Comments: appointment yearly, Dr. Idolina Primer  Dietary issues and exercise activities discussed: Current Exercise Habits: The patient does not participate in regular exercise at present, Exercise limited by: None identified  Diet (meal preparation, eat out, water intake, caffeinated beverages, dairy products, fruits and vegetables): in general, a "healthy" diet  , well balanced, eats a variety of fruits and vegetables daily, limits salt, fat/cholesterol, sugar, caffeine, drinks 6-8 glasses of water daily.   Goals    None     Depression Screen PHQ 2/9 Scores 08/16/2017 08/03/2016  PHQ - 2 Score 0 0    Fall Risk Fall Risk  08/16/2017 08/03/2016  Falls in the past year? No No    Cognitive Function: MMSE - Mini Mental State Exam 08/16/2017  Not completed: Unable to complete        Screening Tests Health Maintenance  Topic Date Due  . TETANUS/TDAP  01/31/1952  . PNA vac Low Risk Adult (2 of 2 - PCV13) 06/10/2007  . INFLUENZA VACCINE  Completed        Plan:     Dental resources provided.  Adult day centers and respite care resources provided. Nurse will check into meals on wheels for the patient.  Continue to eat heart healthy diet (full of fruits, vegetables, whole  grains, lean protein, water--limit salt, fat, and sugar intake) and increase physical activity as tolerated.  I have personally reviewed and noted the following in the patient's chart:   . Medical and social history . Use of alcohol, tobacco or illicit drugs  . Current medications and supplements . Functional ability and status . Nutritional status . Physical activity . Advanced directives . List of other physicians . Vitals . Screenings to include cognitive, depression, and falls . Referrals and appointments  In addition, I have reviewed and discussed with patient certain preventive protocols, quality metrics, and best practice recommendations. A written personalized care plan for preventive services as well as general preventive health recommendations were provided to patient.     Michiel Cowboy, RN   08/16/2017    Medical screening examination/treatment/procedure(s) were performed by non-physician practitioner and as supervising physician I was immediately available for consultation/collaboration. I agree with above. Marzetta Board  Lorretta Harp, MD

## 2017-08-14 NOTE — Progress Notes (Signed)
Subjective:    Patient ID: Steven Matthews, male    DOB: September 21, 1931, 83 y.o.   MRN: [id number]  HPI The patient is here for follow up.  Hypertension: He is taking his medication daily. He is compliant with a low sodium diet.  He denies chest pain, edema, shortness of breath and regular headaches. He is not exercising regularly.  He does not monitor his blood pressure at home.    Hypothyroidism:  He is taking his medication daily.  He denies any recent changes in energy or weight that are unexplained.   Hyperlipidemia: He is taking his medication daily. He is compliant with a low fat/cholesterol diet. He is not exercising regularly. He denies myalgias.   GERD:  He is taking his medication daily as prescribed.  He denies any GERD symptoms and feels his GERD is well controlled.   Anxiety:  He is taking the xanax daily.  Him and his niece feel his anxiety is controlled.   Medications and allergies reviewed with patient and updated if appropriate.  Patient Active Problem List   Diagnosis Date Noted  . GERD (gastroesophageal reflux disease) 02/15/2017  . Essential hypertension, benign 08/03/2016  . Hyperlipidemia 08/03/2016  . Hypothyroidism 08/03/2016  . History of prostate cancer 08/03/2016  . Overactive bladder 08/03/2016  . Intellectual disability 08/03/2016  . Anxiety 08/03/2016  . Nail hypertrophy 08/03/2016  . Chronic respiratory failure with hypoxia ([place]) 04/23/2016  . OSA (obstructive sleep apnea) 04/23/2016  . Upper airway cough syndrome 03/25/2016    Current Outpatient Prescriptions on File Prior to Visit  Medication Sig Dispense Refill  . ALPRAZolam (XANAX) 0.25 MG tablet TAKE 1 TABLET BY MOUTH EVERY 6 HOURS AS NEEDED 40 tablet 0  . amLODipine (NORVASC) 10 MG tablet TAKE 1 TABLET(10 MG) BY MOUTH DAILY 30 tablet 2  . aspirin 81 MG tablet Take 81 mg by mouth every morning.     . hydrochlorothiazide (HYDRODIURIL) 12.5 MG tablet TAKE 1 TABLET(12.5 MG) BY MOUTH DAILY 30 tablet  2  . levothyroxine (SYNTHROID, LEVOTHROID) 50 MCG tablet Take 1 tablet (50 mcg total) by mouth daily. 90 tablet 3  . NONFORMULARY OR COMPOUNDED ITEM [facility]:  Onychomycosis Nail Lacquer - fluconazole 2%, Terbinafine 1%, DMSO, apply daily to affected area. 120 each 2  . pantoprazole (PROTONIX) 40 MG tablet TAKE 1 TABLET BY MOUTH TWICE DAILY 30 MINUTES BEFORE BREAKFAST AND DINNER 180 tablet 3  . pravastatin (PRAVACHOL) 80 MG tablet Take 1 tablet (80 mg total) by mouth at bedtime. 90 tablet 1  . ranitidine (ZANTAC) 300 MG tablet TAKE 1 TABLET(300 MG) BY MOUTH AT BEDTIME 90 tablet 3  . tamsulosin (FLOMAX) 0.4 MG CAPS capsule TAKE 1 CAPSULE(0.4 MG) BY MOUTH DAILY AFTER SUPPER 90 capsule 0   No current facility-administered medications on file prior to visit.     Past Medical History:  Diagnosis Date  . Hyperlipidemia   . Hypertension   . Prostate cancer [facility])     Past Surgical History:  Procedure Laterality Date  . None      Social History   Social History  . Marital status: Single    Spouse name: N/A  . Number of children: 0  . Years of education: N/A   Occupational History  . disabled    Social History Main Topics  . Smoking status: Never Smoker  . Smokeless tobacco: Former Systems developer    Types: Snuff    Quit date: 11/23/2013  . Alcohol use No  .  Drug use: No  . Sexual activity: Not Asked   Other Topics Concern  . None   Social History Narrative  . None    Family History  Problem Relation Age of Onset  . Hypertension Mother   . Stroke Mother   . Colon cancer Neg Hx   . Esophageal cancer Neg Hx   . Stomach cancer Neg Hx   . Rectal cancer Neg Hx   . Liver cancer Neg Hx     Review of Systems  Constitutional: Negative for appetite change, chills and fever.  Respiratory: Positive for cough (chronic) and wheezing (occasional). Negative for shortness of breath.   Cardiovascular: Positive for palpitations (occasional, with activity). Negative for chest pain and  leg swelling.  Gastrointestinal: Negative for abdominal pain.       GERD controlled  Neurological: Negative for light-headedness and headaches.       Objective:   Vitals:   08/16/17 0932  BP: (!) 142/86  Pulse: 83  Temp: 98 F (36.7 C)  SpO2: 98%   Wt Readings from Last 3 Encounters:  08/16/17 159 lb (72.1 kg)  07/16/17 164 lb (74.4 kg)  02/15/17 156 lb (70.8 kg)   Body mass index is 26.46 kg/m.   Physical Exam    Constitutional: Appears well-developed and well-nourished. No distress.  HENT:  Head: Normocephalic and atraumatic.  Neck: Neck supple. No tracheal deviation present. No thyromegaly present.  No cervical lymphadenopathy Cardiovascular: Normal rate, regular rhythm and normal heart sounds.   No murmur heard. No carotid bruit .  No edema Pulmonary/Chest: Effort normal and breath sounds normal. No respiratory distress. No has no wheezes. No rales.  Skin: Skin is warm and dry. Not diaphoretic.  Psychiatric: Normal mood and affect. Behavior is normal.      Assessment & Plan:    See Problem List for Assessment and Plan of chronic medical problems.

## 2017-08-16 ENCOUNTER — Other Ambulatory Visit (INDEPENDENT_AMBULATORY_CARE_PROVIDER_SITE_OTHER): Payer: Medicare Other

## 2017-08-16 ENCOUNTER — Encounter: Payer: Self-pay | Admitting: Internal Medicine

## 2017-08-16 ENCOUNTER — Ambulatory Visit (INDEPENDENT_AMBULATORY_CARE_PROVIDER_SITE_OTHER): Payer: Medicare Other | Admitting: Internal Medicine

## 2017-08-16 ENCOUNTER — Ambulatory Visit: Payer: Medicare Other

## 2017-08-16 VITALS — BP 142/86 | HR 83 | Temp 98.0°F | Ht 65.0 in | Wt 159.0 lb

## 2017-08-16 DIAGNOSIS — I10 Essential (primary) hypertension: Secondary | ICD-10-CM | POA: Diagnosis not present

## 2017-08-16 DIAGNOSIS — R739 Hyperglycemia, unspecified: Secondary | ICD-10-CM

## 2017-08-16 DIAGNOSIS — Z0000 Encounter for general adult medical examination without abnormal findings: Secondary | ICD-10-CM | POA: Diagnosis not present

## 2017-08-16 DIAGNOSIS — E7800 Pure hypercholesterolemia, unspecified: Secondary | ICD-10-CM

## 2017-08-16 DIAGNOSIS — F419 Anxiety disorder, unspecified: Secondary | ICD-10-CM | POA: Diagnosis not present

## 2017-08-16 DIAGNOSIS — Z23 Encounter for immunization: Secondary | ICD-10-CM

## 2017-08-16 DIAGNOSIS — E039 Hypothyroidism, unspecified: Secondary | ICD-10-CM

## 2017-08-16 DIAGNOSIS — K219 Gastro-esophageal reflux disease without esophagitis: Secondary | ICD-10-CM | POA: Diagnosis not present

## 2017-08-16 LAB — TSH: TSH: 4.56 u[IU]/mL — AB (ref 0.35–4.50)

## 2017-08-16 LAB — COMPREHENSIVE METABOLIC PANEL
ALT: 40 U/L (ref 0–53)
AST: 31 U/L (ref 0–37)
Albumin: 4.4 g/dL (ref 3.5–5.2)
Alkaline Phosphatase: 73 U/L (ref 39–117)
BUN: 21 mg/dL (ref 6–23)
CHLORIDE: 101 meq/L (ref 96–112)
CO2: 28 meq/L (ref 19–32)
Calcium: 9.9 mg/dL (ref 8.4–10.5)
Creatinine, Ser: 1.59 mg/dL — ABNORMAL HIGH (ref 0.40–1.50)
GFR: 53.54 mL/min — AB (ref >60.00)
GLUCOSE: 123 mg/dL — AB (ref 70–99)
POTASSIUM: 3.4 meq/L — AB (ref 3.5–5.1)
Sodium: 141 mEq/L (ref 135–145)
Total Bilirubin: 0.8 mg/dL (ref 0.2–1.2)
Total Protein: 8.5 g/dL — ABNORMAL HIGH (ref 6.0–8.3)

## 2017-08-16 LAB — CBC WITH DIFFERENTIAL/PLATELET
Basophils Absolute: 0.1 10*3/uL (ref 0.0–0.1)
Basophils Relative: 1.1 % (ref 0.0–3.0)
EOS PCT: 3.3 % (ref 0.0–5.0)
Eosinophils Absolute: 0.2 10*3/uL (ref 0.0–0.7)
HCT: 42.9 % (ref 39.0–52.0)
Hemoglobin: 14.2 g/dL (ref 13.0–17.0)
LYMPHS ABS: 1.3 10*3/uL (ref 0.7–4.0)
Lymphocytes Relative: 20.8 % (ref 12.0–46.0)
MCHC: 33 g/dL (ref 30.0–36.0)
MCV: 86.9 fl (ref 78.0–100.0)
MONOS PCT: 7 % (ref 3.0–12.0)
Monocytes Absolute: 0.4 10*3/uL (ref 0.1–1.0)
NEUTROS ABS: 4.3 10*3/uL (ref 1.4–7.7)
NEUTROS PCT: 67.8 % (ref 43.0–77.0)
PLATELETS: 245 10*3/uL (ref 150.0–400.0)
RBC: 4.93 Mil/uL (ref 4.22–5.81)
RDW: 15.8 % — ABNORMAL HIGH (ref 11.5–15.5)
WBC: 6.4 10*3/uL (ref 4.0–10.5)

## 2017-08-16 LAB — LIPID PANEL
Cholesterol: 179 mg/dL (ref 0–200)
HDL: 55.6 mg/dL (ref >39.00)
LDL Cholesterol: 96 mg/dL (ref 0–99)
NONHDL: 123.16
Total CHOL/HDL Ratio: 3
Triglycerides: 134 mg/dL (ref 0.0–149.0)
VLDL: 26.8 mg/dL (ref 0.0–40.0)

## 2017-08-16 LAB — HEMOGLOBIN A1C: HEMOGLOBIN A1C: 6 % (ref 4.6–6.5)

## 2017-08-16 NOTE — Assessment & Plan Note (Signed)
Check lipid panel  Continue daily statin Regular exercise and healthy diet encouraged  

## 2017-08-16 NOTE — Assessment & Plan Note (Signed)
Check tsh  Titrate med dose if needed  

## 2017-08-16 NOTE — Assessment & Plan Note (Signed)
Controlled, stable Continue current dose of medication  

## 2017-08-16 NOTE — Patient Instructions (Addendum)
Continue to eat heart healthy diet (full of fruits, vegetables, whole grains, lean protein, water--limit salt, fat, and sugar intake) and increase physical activity as tolerated.   Steven Matthews , Thank you for taking time to come for your Medicare Wellness Visit. I appreciate your ongoing commitment to your health goals. Please review the following plan we discussed and let me know if I can assist you in the future.   These are the goals we discussed: Goals    . Maintain current health status       This is a list of the screening recommended for you and due dates:  Health Maintenance  Topic Date Due  . Tetanus Vaccine  01/31/1952  . Pneumonia vaccines (2 of 2 - PCV13) 06/10/2007  . Flu Shot  Completed     Influenza Virus Vaccine injection What is this medicine? INFLUENZA VIRUS VACCINE (in floo EN zuh VAHY ruhs vak SEEN) helps to reduce the risk of getting influenza also known as the flu. The vaccine only helps protect you against some strains of the flu. This medicine may be used for other purposes; ask your health care provider or pharmacist if you have questions. COMMON BRAND NAME(S): Afluria, Agriflu, Alfuria, FLUAD, Fluarix, Fluarix Quadrivalent, Flublok, Flublok Quadrivalent, FLUCELVAX, Flulaval, Fluvirin, Fluzone, Fluzone High-Dose, Fluzone Intradermal What should I tell my health care provider before I take this medicine? They need to know if you have any of these conditions: -bleeding disorder like hemophilia -fever or infection -Guillain-Barre syndrome or other neurological problems -immune system problems -infection with the human immunodeficiency virus (HIV) or AIDS -low blood platelet counts -multiple sclerosis -an unusual or allergic reaction to influenza virus vaccine, latex, other medicines, foods, dyes, or preservatives. Different brands of vaccines contain different allergens. Some may contain latex or eggs. Talk to your doctor about your allergies to make sure that  you get the right vaccine. -pregnant or trying to get pregnant -breast-feeding How should I use this medicine? This vaccine is for injection into a muscle or under the skin. It is given by a health care professional. A copy of Vaccine Information Statements will be given before each vaccination. Read this sheet carefully each time. The sheet may change frequently. Talk to your healthcare provider to see which vaccines are right for you. Some vaccines should not be used in all age groups. Overdosage: If you think you have taken too much of this medicine contact a poison control center or emergency room at once. NOTE: This medicine is only for you. Do not share this medicine with others. What if I miss a dose? This does not apply. What may interact with this medicine? -chemotherapy or radiation therapy -medicines that lower your immune system like etanercept, anakinra, infliximab, and adalimumab -medicines that treat or prevent blood clots like warfarin -phenytoin -steroid medicines like prednisone or cortisone -theophylline -vaccines This list may not describe all possible interactions. Give your health care provider a list of all the medicines, herbs, non-prescription drugs, or dietary supplements you use. Also tell them if you smoke, drink alcohol, or use illegal drugs. Some items may interact with your medicine. What should I watch for while using this medicine? Report any side effects that do not go away within 3 days to your doctor or health care professional. Call your health care provider if any unusual symptoms occur within 6 weeks of receiving this vaccine. You may still catch the flu, but the illness is not usually as bad. You cannot get the flu  from the vaccine. The vaccine will not protect against colds or other illnesses that may cause fever. The vaccine is needed every year. What side effects may I notice from receiving this medicine? Side effects that you should report to your  doctor or health care professional as soon as possible: -allergic reactions like skin rash, itching or hives, swelling of the face, lips, or tongue Side effects that usually do not require medical attention (report to your doctor or health care professional if they continue or are bothersome): -fever -headache -muscle aches and pains -pain, tenderness, redness, or swelling at the injection site -tiredness This list may not describe all possible side effects. Call your doctor for medical advice about side effects. You may report side effects to FDA at 1-800-FDA-1088. Where should I keep my medicine? The vaccine will be given by a health care professional in a clinic, pharmacy, doctor's office, or other health care setting. You will not be given vaccine doses to store at home. NOTE: This sheet is a summary. It may not cover all possible information. If you have questions about this medicine, talk to your doctor, pharmacist, or health care provider.  2018 Elsevier/Gold Standard (2015-05-31 10:07:28)

## 2017-08-16 NOTE — Assessment & Plan Note (Signed)
Check a1c 

## 2017-08-16 NOTE — Assessment & Plan Note (Signed)
BP Readings from Last 3 Encounters:  08/16/17 (!) 142/86  07/16/17 134/62  02/15/17 (!) 152/86   BP controlled Current regimen effective and well tolerated Continue current medications at current doses  cmp

## 2017-08-16 NOTE — Assessment & Plan Note (Signed)
GERD controlled Continue daily medication  

## 2017-08-19 ENCOUNTER — Encounter: Payer: Self-pay | Admitting: Internal Medicine

## 2017-08-19 DIAGNOSIS — R7303 Prediabetes: Secondary | ICD-10-CM | POA: Insufficient documentation

## 2017-08-19 DIAGNOSIS — E119 Type 2 diabetes mellitus without complications: Secondary | ICD-10-CM | POA: Insufficient documentation

## 2017-08-19 DIAGNOSIS — N1832 Chronic kidney disease, stage 3b: Secondary | ICD-10-CM | POA: Insufficient documentation

## 2017-08-19 MED ORDER — LEVOTHYROXINE SODIUM 75 MCG PO TABS: 75.0000 ug | ORAL_TABLET | Freq: Every day | ORAL | 3 refills | Status: DC

## 2017-08-26 ENCOUNTER — Other Ambulatory Visit: Payer: Self-pay | Admitting: Internal Medicine

## 2017-09-02 ENCOUNTER — Other Ambulatory Visit: Payer: Self-pay | Admitting: Internal Medicine

## 2017-09-07 ENCOUNTER — Telehealth: Payer: Self-pay | Admitting: Internal Medicine

## 2017-09-07 NOTE — Telephone Encounter (Signed)
Not sure if his niece read the mychart message - can you call hre and make sure -    Karel's blood work shows that his kidney function is slightly decreased , but it is better than it was 6 months ago.  His liver tests are normal. His cholesterol is very good.   Thyroid function using minimally underactive. Lets Increase his levothyroxine this 75 g daily. He can take 1-1/2 pills avoid he has currently and I will send a 75 g pill to [facility].  His routine blood counts are normal.    His sugars have been slightly elevated over the past 3 months and he is a prediabetic. We will monitor this every 6 months.   Let me know if you have any questions or concerns.   Dr. Billey Gosling.

## 2017-09-08 NOTE — Telephone Encounter (Signed)
LVM with pts niece. Also noticed that MyChart message was read yesterday.

## 2017-09-11 ENCOUNTER — Other Ambulatory Visit: Payer: Self-pay | Admitting: Internal Medicine

## 2017-09-22 ENCOUNTER — Other Ambulatory Visit: Payer: Self-pay | Admitting: Internal Medicine

## 2017-09-23 NOTE — Telephone Encounter (Signed)
[place] Controlled Substance Database checked. Last filled on 08/10/17

## 2017-09-23 NOTE — Telephone Encounter (Signed)
RX faxed to [facility] 

## 2017-10-30 ENCOUNTER — Other Ambulatory Visit: Payer: Self-pay | Admitting: Internal Medicine

## 2017-12-05 IMAGING — RF DG ESOPHAGUS
9 of 14 series · 13 of 24 positions shown · non-contrast
Comparison: Chest CT 09/02/2015.  Radiographs 07/14/2016.

CLINICAL DATA: 85-year-old with persistent cough for 1 year. No
other complaints. Obstructive sleep apnea.

EXAM:
ESOPHOGRAM / BARIUM SWALLOW / BARIUM TABLET STUDY
TECHNIQUE: Combined double contrast and single contrast examination performed
using effervescent crystals, thick barium liquid, and thin barium
liquid. The patient was observed with fluoroscopy swallowing a 13 mm
barium sulphate tablet.
FLUOROSCOPY TIME:  Fluoroscopy Time: 2 minutes and 6 seconds of
low-dose pulsed fluoro.
Radiation Exposure Index (if provided by the fluoroscopic device):
31.6 mGy
Number of Acquired Spot Images: 0

[Series 1: cp_standard · 0.35mm/px · 2 of 116 frames shown (1 of 9)]
[frame 18/116]
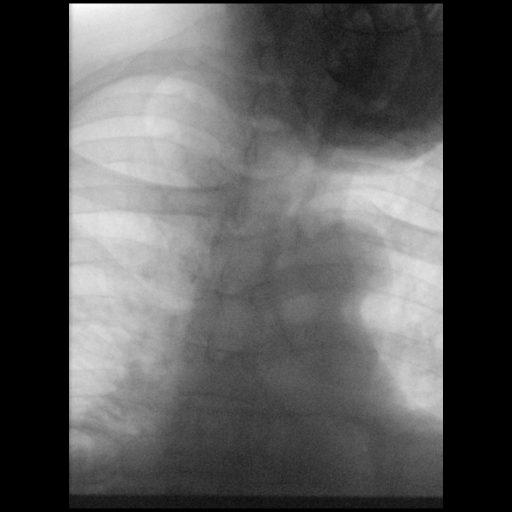
[frame 99/116]
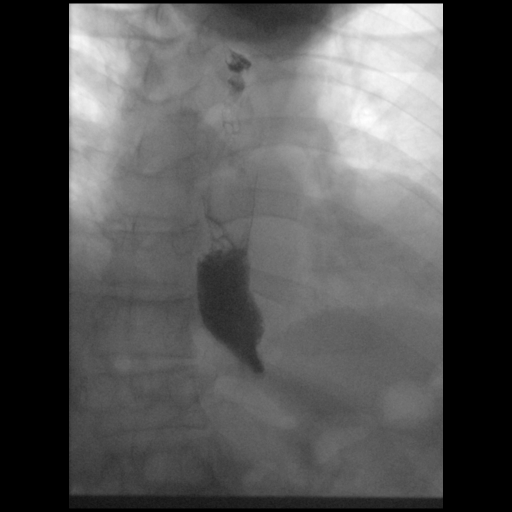

[Series 2: cp_standard · 0.35mm/px · 1 of 84 frames shown (2 of 9)]
[frame 13/84]
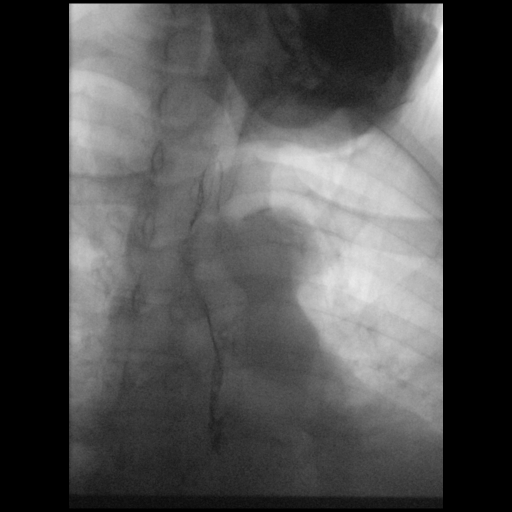

[Series 3: cp_standard · 0.17mm/px · 1 of 1 slices shown (3 of 9)]
[im 1/1]
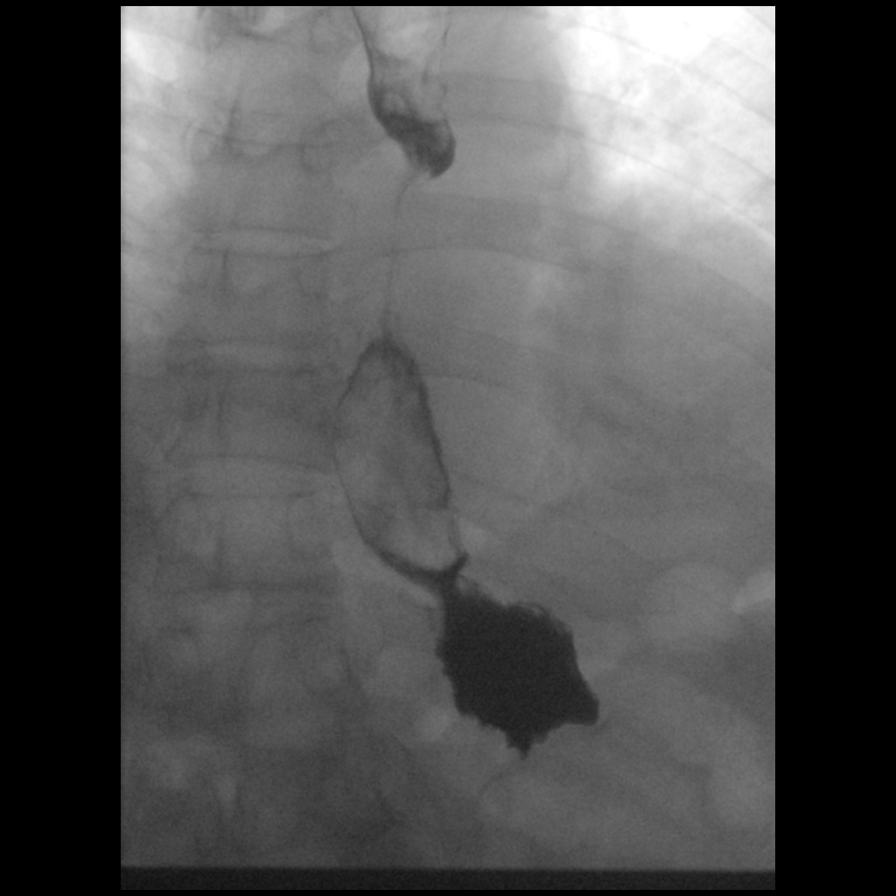

[Series 5: cp_standard · 0.17mm/px · 1 of 1 slices shown (4 of 9)]
[im 1/1]
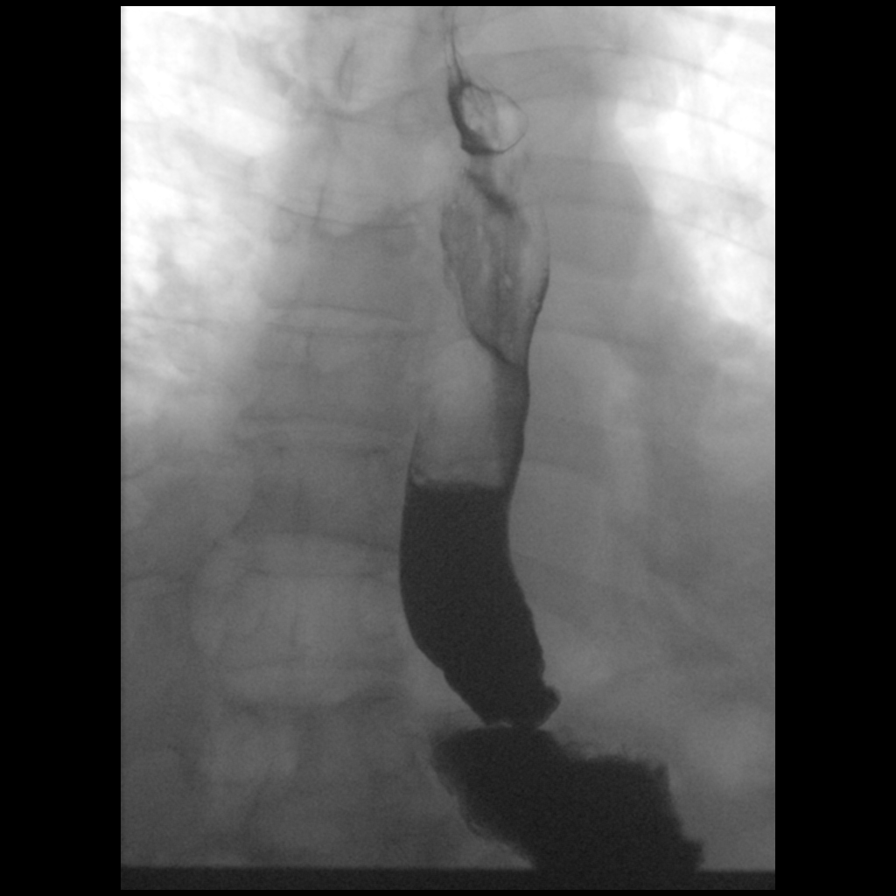

[Series 7: cp_standard · 0.34mm/px · 2 of 174 frames shown (5 of 9)]
[frame 56/174]
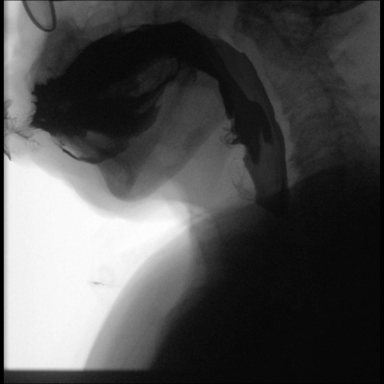
[frame 148/174]
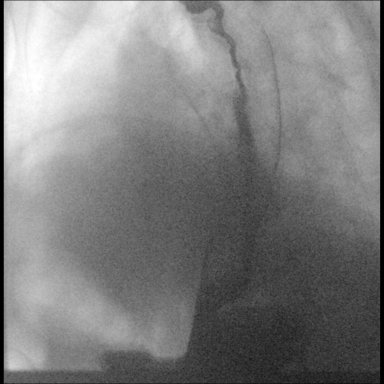

[Series 8: cp_standard · 0.38mm/px · 2 of 121 frames shown (6 of 9)]
[frame 19/121]
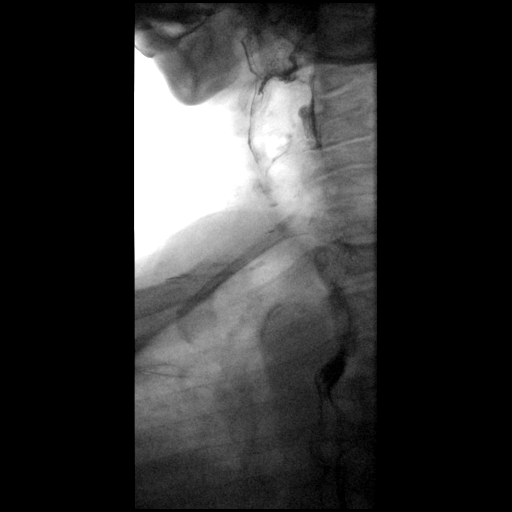
[frame 103/121]
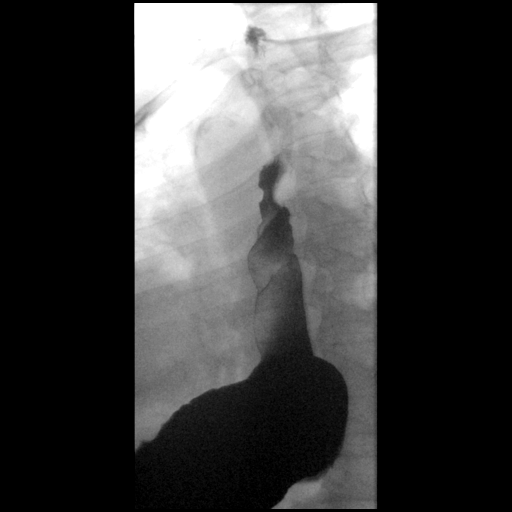

[Series 10: cp_standard · 0.19mm/px · 1 of 1 slices shown (7 of 9)]
[im 1/1]
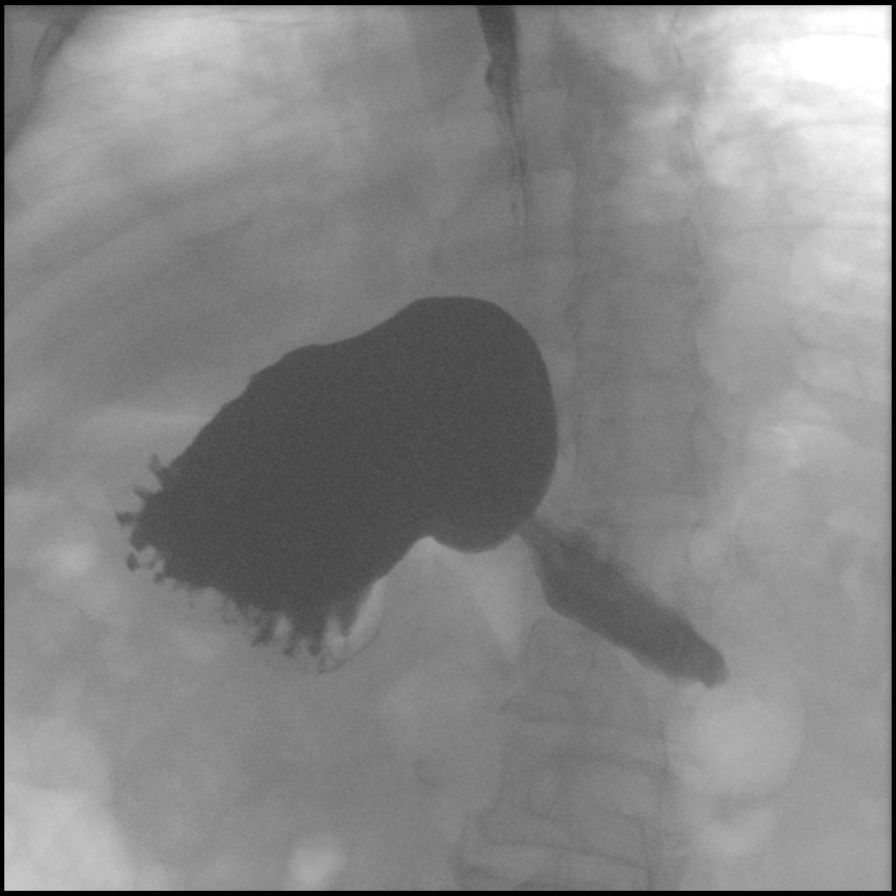

[Series 13: cp_standard · 0.18mm/px · 1 of 1 slices shown (8 of 9)]
[im 1/1]
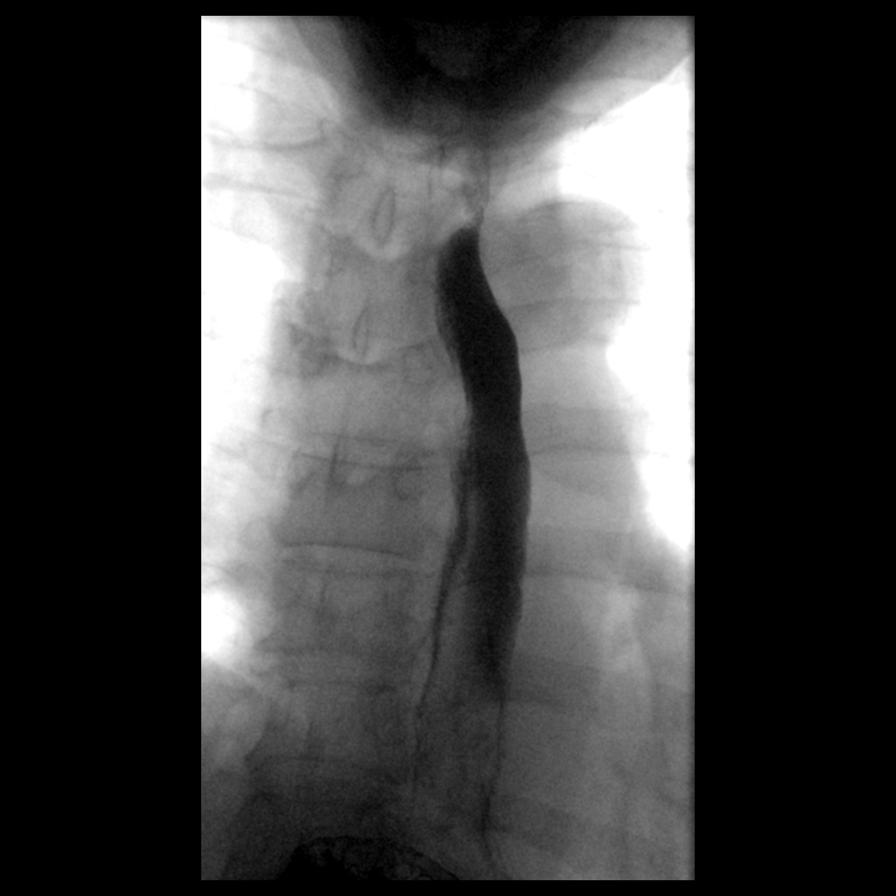

[Series 15: cp_standard · 0.35mm/px · 2 of 8 frames shown (9 of 9)]
[frame 2/8]
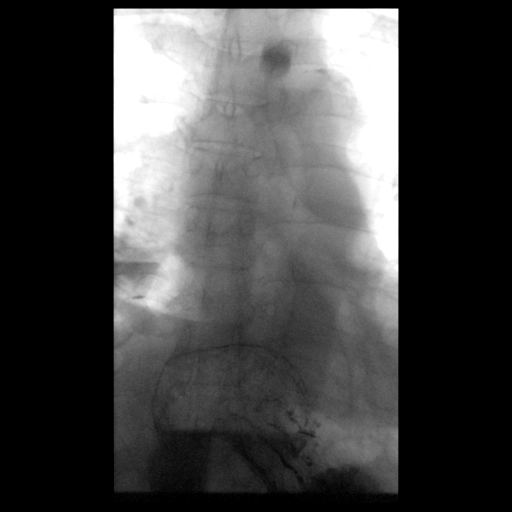
[frame 8/8]
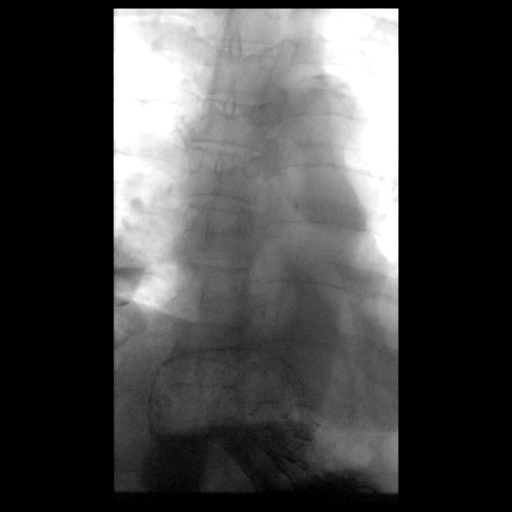

[13 of 24 positions shown; findings below may reference images not displayed]

FINDINGS: The patient swallowed the barium with some hesitation. There is
significant esophageal dysmotility with a decreased primary
stripping wave and diffuse tertiary contractions. Rapid sequence
imaging of the pharynx in the lateral projection demonstrates no
focal mucosal lesion or laryngeal penetration.

No esophageal mucosal lesion, stricture or significant hiatal hernia
is seen. With the patient supine, there was stasis of esophageal
contents. The GE junction appears patulous, and mild reflux was
elicited with the water siphon test. A 13 mm barium tablet was
administered and passed without delay into the stomach.
IMPRESSION: 1. Esophageal dysmotility with patulous GE junction and mild
gastroesophageal reflux. These findings may predispose the patient
to aspiration. No laryngeal penetration identified.
2. No evidence of esophageal stricture, mass or ulceration.

## 2017-12-09 ENCOUNTER — Other Ambulatory Visit: Payer: Self-pay | Admitting: Internal Medicine

## 2017-12-09 NOTE — Telephone Encounter (Signed)
[place] Controlled Substance Database checked. Last filled on 11/01/17

## 2018-01-17 ENCOUNTER — Other Ambulatory Visit: Payer: Self-pay | Admitting: Internal Medicine

## 2018-01-18 NOTE — Telephone Encounter (Signed)
[place] Controlled Substance Database checked. Last filled on 12/09/17

## 2018-01-28 ENCOUNTER — Other Ambulatory Visit: Payer: Self-pay | Admitting: Internal Medicine

## 2018-02-12 ENCOUNTER — Other Ambulatory Visit: Payer: Self-pay | Admitting: Internal Medicine

## 2018-02-12 NOTE — Patient Instructions (Addendum)
  Test(s) ordered today. Your results will be released to [place] (or called to you) after review, usually within 72hours after test completion. If any changes need to be made, you will be notified at that same time.  All other Health Maintenance issues reviewed.   All recommended immunizations and age-appropriate screenings are up-to-date or discussed.  No immunizations administered today.   Medications reviewed and updated.  Changes include stopping the hydrochlorothiazide and starting irbesartan for his blood pressure.    Your prescription(s) have been submitted to your pharmacy. Please take as directed and contact our office if you believe you are having problem(s) with the medication(s).   Please followup in 6 months

## 2018-02-12 NOTE — Progress Notes (Signed)
Subjective:    Patient ID: Steven Matthews, male    DOB: 1931-01-31, 84 y.o.   MRN: [id number]  HPI The patient is here for follow up.  He is here with his niece who provides most of the history.  Hypertension: He is taking his medication daily. He is compliant with a low sodium diet.  He denies chest pain, palpitations, edema and regular headaches.  He occasionally has some shortness of breath with exertion, which is not new.  He is not exercising regularly.  He does not monitor his blood pressure at home.    Hypothyroidism:  He is taking his medication daily.  He denies any recent changes in energy or weight that are unexplained.   Hyperlipidemia: He is taking his medication daily. He is compliant with a low fat/cholesterol diet. He is not exercising regularly. He denies myalgias.   GERD:  He is taking his medication daily as prescribed.  He denies any GERD symptoms and feels his GERD is well controlled.   Anxiety: His daughter gives him the Xanax on a daily basis. He denies any side effects from the medication.  His knees and he feel his anxiety is well controlled and he is happy with his current dose of medication.   Prediabetes:  He is compliant with a low sugar/carbohydrate diet.  He is not exercising regularly.    Medications and allergies reviewed with patient and updated if appropriate.  Patient Active Problem List   Diagnosis Date Noted  . Prediabetes 08/19/2017  . Hyperglycemia 08/16/2017  . GERD (gastroesophageal reflux disease) 02/15/2017  . Essential hypertension, benign 08/03/2016  . Hyperlipidemia 08/03/2016  . Hypothyroidism 08/03/2016  . History of prostate cancer 08/03/2016  . Overactive bladder 08/03/2016  . Intellectual disability 08/03/2016  . Anxiety 08/03/2016  . Nail hypertrophy 08/03/2016  . Chronic respiratory failure with hypoxia ([place]) 04/23/2016  . OSA (obstructive sleep apnea) 04/23/2016  . Upper airway cough syndrome 03/25/2016    Current  Outpatient Medications on File Prior to Visit  Medication Sig Dispense Refill  . ALPRAZolam (XANAX) 0.25 MG tablet TAKE 1 TABLET BY MOUTH EVERY 6 HOURS AS NEEDED 40 tablet 0  . amLODipine (NORVASC) 10 MG tablet Take 1 tablet (10 mg total) by mouth daily. 90 tablet 1  . aspirin 81 MG tablet Take 81 mg by mouth every morning.     . hydrochlorothiazide (HYDRODIURIL) 12.5 MG tablet TAKE 1 TABLET(12.5 MG) BY MOUTH DAILY 30 tablet 5  . levothyroxine (SYNTHROID, LEVOTHROID) 75 MCG tablet Take 1 tablet (75 mcg total) by mouth daily. 90 tablet 3  . NONFORMULARY OR COMPOUNDED ITEM [facility]:  Onychomycosis Nail Lacquer - fluconazole 2%, Terbinafine 1%, DMSO, apply daily to affected area. 120 each 2  . pantoprazole (PROTONIX) 40 MG tablet TAKE 1 TABLET BY MOUTH TWICE DAILY 30 MINUTES BEFORE BREAKFAST AND DINNER 180 tablet 3  . pravastatin (PRAVACHOL) 80 MG tablet TAKE 1 TABLET(80 MG) BY MOUTH AT BEDTIME 90 tablet 1  . ranitidine (ZANTAC) 300 MG tablet TAKE 1 TABLET(300 MG) BY MOUTH AT BEDTIME 90 tablet 3  . tamsulosin (FLOMAX) 0.4 MG CAPS capsule TAKE 1 CAPSULE(0.4 MG) BY MOUTH DAILY AFTER SUPPER 90 capsule 0   No current facility-administered medications on file prior to visit.     Past Medical History:  Diagnosis Date  . Hyperlipidemia   . Hypertension   . Prostate cancer [facility])     Past Surgical History:  Procedure Laterality Date  . None  Social History   Socioeconomic History  . Marital status: Single    Spouse name: Not on file  . Number of children: 0  . Years of education: Not on file  . Highest education level: Not on file  Occupational History  . Occupation: disabled  Social Needs  . Financial resource strain: Not on file  . Food insecurity:    Worry: Not on file    Inability: Not on file  . Transportation needs:    Medical: Not on file    Non-medical: Not on file  Tobacco Use  . Smoking status: Never Smoker  . Smokeless tobacco: Former Systems developer    Types: Snuff    Substance and Sexual Activity  . Alcohol use: No    Alcohol/week: 0.0 oz  . Drug use: No  . Sexual activity: Not on file  Lifestyle  . Physical activity:    Days per week: Not on file    Minutes per session: Not on file  . Stress: Not on file  Relationships  . Social connections:    Talks on phone: Not on file    Gets together: Not on file    Attends religious service: Not on file    Active member of club or organization: Not on file    Attends meetings of clubs or organizations: Not on file    Relationship status: Not on file  Other Topics Concern  . Not on file  Social History Narrative  . Not on file    Family History  Problem Relation Age of Onset  . Hypertension Mother   . Stroke Mother   . Colon cancer Neg Hx   . Esophageal cancer Neg Hx   . Stomach cancer Neg Hx   . Rectal cancer Neg Hx   . Liver cancer Neg Hx     Review of Systems  Constitutional: Negative for appetite change and fatigue.  Respiratory: Positive for cough (dry, chronic) and shortness of breath (occasionally). Negative for wheezing.   Cardiovascular: Negative for chest pain, palpitations and leg swelling.  Gastrointestinal: Negative for abdominal pain.  Neurological: Negative for dizziness and headaches.  Psychiatric/Behavioral: Negative for dysphoric mood. The patient is nervous/anxious (Controlled).        Objective:   Vitals:   02/14/18 1102  BP: (!) 146/72  Pulse: 88  Resp: 16  Temp: 98.2 F (36.8 C)  SpO2: 90%   BP Readings from Last 3 Encounters:  02/14/18 (!) 146/72  08/16/17 (!) 142/86  07/16/17 134/62   Wt Readings from Last 3 Encounters:  02/14/18 158 lb (71.7 kg)  08/16/17 159 lb (72.1 kg)  07/16/17 164 lb (74.4 kg)   Body mass index is 26.29 kg/m.   Physical Exam    Constitutional: Appears well-developed and well-nourished. No distress.  HENT:  Head: Normocephalic and atraumatic.  Neck: Neck supple. No tracheal deviation present. No thyromegaly present.  No  cervical lymphadenopathy Cardiovascular: Normal rate, regular rhythm and normal heart sounds.   No murmur heard. No carotid bruit .  No edema Pulmonary/Chest: Effort normal and breath sounds normal. No respiratory distress. No has no wheezes. No rales.  Skin: Skin is warm and dry. Not diaphoretic.  Psychiatric: Normal mood and affect. Behavior is normal.      Assessment & Plan:    See Problem List for Assessment and Plan of chronic medical problems.

## 2018-02-14 ENCOUNTER — Encounter: Payer: Self-pay | Admitting: Internal Medicine

## 2018-02-14 ENCOUNTER — Ambulatory Visit (INDEPENDENT_AMBULATORY_CARE_PROVIDER_SITE_OTHER): Payer: Medicare Other | Admitting: Internal Medicine

## 2018-02-14 ENCOUNTER — Other Ambulatory Visit (INDEPENDENT_AMBULATORY_CARE_PROVIDER_SITE_OTHER): Payer: Medicare Other

## 2018-02-14 VITALS — BP 146/72 | HR 88 | Temp 98.2°F | Resp 16 | Wt 158.0 lb

## 2018-02-14 DIAGNOSIS — E7849 Other hyperlipidemia: Secondary | ICD-10-CM | POA: Diagnosis not present

## 2018-02-14 DIAGNOSIS — K219 Gastro-esophageal reflux disease without esophagitis: Secondary | ICD-10-CM | POA: Diagnosis not present

## 2018-02-14 DIAGNOSIS — F419 Anxiety disorder, unspecified: Secondary | ICD-10-CM | POA: Diagnosis not present

## 2018-02-14 DIAGNOSIS — R7303 Prediabetes: Secondary | ICD-10-CM

## 2018-02-14 DIAGNOSIS — R739 Hyperglycemia, unspecified: Secondary | ICD-10-CM

## 2018-02-14 DIAGNOSIS — I10 Essential (primary) hypertension: Secondary | ICD-10-CM

## 2018-02-14 DIAGNOSIS — N3281 Overactive bladder: Secondary | ICD-10-CM | POA: Diagnosis not present

## 2018-02-14 DIAGNOSIS — E039 Hypothyroidism, unspecified: Secondary | ICD-10-CM

## 2018-02-14 LAB — COMPREHENSIVE METABOLIC PANEL
ALK PHOS: 63 U/L (ref 39–117)
ALT: 14 U/L (ref 0–53)
AST: 23 U/L (ref 0–37)
Albumin: 4 g/dL (ref 3.5–5.2)
BUN: 23 mg/dL (ref 6–23)
CO2: 29 meq/L (ref 19–32)
Calcium: 9.6 mg/dL (ref 8.4–10.5)
Chloride: 101 mEq/L (ref 96–112)
Creatinine, Ser: 1.55 mg/dL — ABNORMAL HIGH (ref 0.40–1.50)
GFR: 55.07 mL/min — ABNORMAL LOW (ref >60.00)
GLUCOSE: 125 mg/dL — AB (ref 70–99)
POTASSIUM: 3.6 meq/L (ref 3.5–5.1)
SODIUM: 139 meq/L (ref 135–145)
TOTAL PROTEIN: 8.4 g/dL — AB (ref 6.0–8.3)
Total Bilirubin: 1 mg/dL (ref 0.2–1.2)

## 2018-02-14 LAB — CBC WITH DIFFERENTIAL/PLATELET
Basophils Absolute: 0 10*3/uL (ref 0.0–0.1)
Basophils Relative: 0.9 % (ref 0.0–3.0)
EOS PCT: 2 % (ref 0.0–5.0)
Eosinophils Absolute: 0.1 10*3/uL (ref 0.0–0.7)
HCT: 41.2 % (ref 39.0–52.0)
Hemoglobin: 14 g/dL (ref 13.0–17.0)
LYMPHS ABS: 1.3 10*3/uL (ref 0.7–4.0)
Lymphocytes Relative: 25.6 % (ref 12.0–46.0)
MCHC: 33.9 g/dL (ref 30.0–36.0)
MCV: 86.2 fl (ref 78.0–100.0)
MONO ABS: 0.4 10*3/uL (ref 0.1–1.0)
MONOS PCT: 8.6 % (ref 3.0–12.0)
NEUTROS PCT: 62.9 % (ref 43.0–77.0)
Neutro Abs: 3.1 10*3/uL (ref 1.4–7.7)
PLATELETS: 231 10*3/uL (ref 150.0–400.0)
RBC: 4.78 Mil/uL (ref 4.22–5.81)
RDW: 14.4 % (ref 11.5–15.5)
WBC: 4.9 10*3/uL (ref 4.0–10.5)

## 2018-02-14 LAB — LIPID PANEL
CHOLESTEROL: 149 mg/dL (ref 0–200)
HDL: 52.5 mg/dL (ref >39.00)
LDL CALC: 73 mg/dL (ref 0–99)
NONHDL: 96.69
Total CHOL/HDL Ratio: 3
Triglycerides: 119 mg/dL (ref 0.0–149.0)
VLDL: 23.8 mg/dL (ref 0.0–40.0)

## 2018-02-14 LAB — HEMOGLOBIN A1C: HEMOGLOBIN A1C: 6.2 % (ref 4.6–6.5)

## 2018-02-14 LAB — TSH: TSH: 2.01 u[IU]/mL (ref 0.35–4.50)

## 2018-02-14 MED ORDER — IRBESARTAN 75 MG PO TABS: 75.0000 mg | ORAL_TABLET | Freq: Every day | ORAL | 5 refills | Status: DC

## 2018-02-14 NOTE — Assessment & Plan Note (Signed)
GERD well controlled with current medications Continue pantoprazole and Zantac

## 2018-02-14 NOTE — Assessment & Plan Note (Signed)
Overall controlled Continue Xanax daily and an additional dose if needed

## 2018-02-14 NOTE — Assessment & Plan Note (Signed)
Sees urology Taking flomax

## 2018-02-14 NOTE — Assessment & Plan Note (Signed)
Blood pressure slightly above normal Given decreased kidney function and frequent urination we will discontinue hydrochlorothiazide and start Avapro 75 mg daily-we will titrate dose as needed Continue amlodipine 10 mg daily CMP, TSH, CBC

## 2018-02-14 NOTE — Assessment & Plan Note (Signed)
Check tsh  Titrate med dose if needed  

## 2018-02-14 NOTE — Assessment & Plan Note (Signed)
Check A1c Continue low sugar/carbohydrate diet He does not exercise regularly Overall weight controlled especially given age

## 2018-02-14 NOTE — Assessment & Plan Note (Addendum)
Check lipid panel, TSH Continue daily statin  healthy diet encouraged

## 2018-02-17 ENCOUNTER — Encounter: Payer: Self-pay | Admitting: Internal Medicine

## 2018-02-24 ENCOUNTER — Other Ambulatory Visit: Payer: Self-pay | Admitting: Internal Medicine

## 2018-03-03 ENCOUNTER — Other Ambulatory Visit: Payer: Self-pay | Admitting: Internal Medicine

## 2018-03-04 NOTE — Telephone Encounter (Signed)
Routing to dr burns, please advise, thanks 

## 2018-03-12 ENCOUNTER — Other Ambulatory Visit: Payer: Self-pay | Admitting: Internal Medicine

## 2018-03-29 ENCOUNTER — Other Ambulatory Visit: Payer: Self-pay | Admitting: Internal Medicine

## 2018-04-07 ENCOUNTER — Other Ambulatory Visit: Payer: Self-pay | Admitting: Emergency Medicine

## 2018-04-07 MED ORDER — IRBESARTAN 75 MG PO TABS: 75.0000 mg | ORAL_TABLET | Freq: Every day | ORAL | 1 refills | Status: DC

## 2018-04-09 ENCOUNTER — Other Ambulatory Visit: Payer: Self-pay | Admitting: Internal Medicine

## 2018-04-11 NOTE — Telephone Encounter (Signed)
[place] Controlled Substance Database checked. Last filled on 03/04/18 

## 2018-04-14 ENCOUNTER — Ambulatory Visit: Payer: Medicare Other | Admitting: Internal Medicine

## 2018-04-18 NOTE — Progress Notes (Deleted)
Subjective:    Patient ID: Steven Matthews, male    DOB: [birth year], 84 y.o.   MRN: [id number]  HPI The patient is here for an acute visit.   Hearing difficulty:     Medications and allergies reviewed with patient and updated if appropriate.  Patient Active Problem List   Diagnosis Date Noted  . Prediabetes 08/19/2017  . GERD (gastroesophageal reflux disease) 02/15/2017  . Essential hypertension, benign 08/03/2016  . Hyperlipidemia 08/03/2016  . Hypothyroidism 08/03/2016  . History of prostate cancer 08/03/2016  . Overactive bladder 08/03/2016  . Intellectual disability 08/03/2016  . Anxiety 08/03/2016  . Nail hypertrophy 08/03/2016  . Chronic respiratory failure with hypoxia ([place]) 04/23/2016  . OSA (obstructive sleep apnea) 04/23/2016  . Upper airway cough syndrome 03/25/2016    Current Outpatient Medications on File Prior to Visit  Medication Sig Dispense Refill  . ALPRAZolam (XANAX) 0.25 MG tablet TAKE 1 TABLET BY MOUTH EVERY 6 HOURS AS NEEDED 40 tablet 0  . amLODipine (NORVASC) 10 MG tablet TAKE 1 TABLET(10 MG) BY MOUTH DAILY 90 tablet 3  . aspirin 81 MG tablet Take 81 mg by mouth every morning.     . irbesartan (AVAPRO) 75 MG tablet Take 1 tablet (75 mg total) by mouth daily. 90 tablet 1  . levothyroxine (SYNTHROID, LEVOTHROID) 75 MCG tablet Take 1 tablet (75 mcg total) by mouth daily. 90 tablet 3  . NONFORMULARY OR COMPOUNDED ITEM [facility]:  Onychomycosis Nail Lacquer - fluconazole 2%, Terbinafine 1%, DMSO, apply daily to affected area. 120 each 2  . pantoprazole (PROTONIX) 40 MG tablet TAKE 1 TABLET BY MOUTH TWICE DAILY 30 MINUTES BEFORE BREAKFAST AND DINNER 180 tablet 3  . pravastatin (PRAVACHOL) 80 MG tablet TAKE 1 TABLET(80 MG) BY MOUTH AT BEDTIME 90 tablet 1  . ranitidine (ZANTAC) 300 MG tablet TAKE 1 TABLET(300 MG) BY MOUTH AT BEDTIME 90 tablet 3  . tamsulosin (FLOMAX) 0.4 MG CAPS capsule TAKE 1 CAPSULE(0.4 MG) BY MOUTH DAILY AFTER SUPPER 90 capsule 0    No current facility-administered medications on file prior to visit.     Past Medical History:  Diagnosis Date  . Hyperlipidemia   . Hypertension   . Prostate cancer [facility])     Past Surgical History:  Procedure Laterality Date  . None      Social History   Socioeconomic History  . Marital status: Single    Spouse name: Not on file  . Number of children: 0  . Years of education: Not on file  . Highest education level: Not on file  Occupational History  . Occupation: disabled  Social Needs  . Financial resource strain: Not on file  . Food insecurity:    Worry: Not on file    Inability: Not on file  . Transportation needs:    Medical: Not on file    Non-medical: Not on file  Tobacco Use  . Smoking status: Never Smoker  . Smokeless tobacco: Former Systems developer    Types: Snuff  Substance and Sexual Activity  . Alcohol use: No    Alcohol/week: 0.0 oz  . Drug use: No  . Sexual activity: Not on file  Lifestyle  . Physical activity:    Days per week: Not on file    Minutes per session: Not on file  . Stress: Not on file  Relationships  . Social connections:    Talks on phone: Not on file    Gets together: Not on file  Attends religious service: Not on file    Active member of club or organization: Not on file    Attends meetings of clubs or organizations: Not on file    Relationship status: Not on file  Other Topics Concern  . Not on file  Social History Narrative  . Not on file    Family History  Problem Relation Age of Onset  . Hypertension Mother   . Stroke Mother   . Colon cancer Neg Hx   . Esophageal cancer Neg Hx   . Stomach cancer Neg Hx   . Rectal cancer Neg Hx   . Liver cancer Neg Hx     Review of Systems     Objective:  There were no vitals filed for this visit. BP Readings from Last 3 Encounters:  02/14/18 (!) 146/72  08/16/17 (!) 142/86  07/16/17 134/62   Wt Readings from Last 3 Encounters:  02/14/18 158 lb (71.7 kg)  08/16/17 159 lb  (72.1 kg)  07/16/17 164 lb (74.4 kg)   There is no height or weight on file to calculate BMI.   Physical Exam         Assessment & Plan:    See Problem List for Assessment and Plan of chronic medical problems.

## 2018-04-19 ENCOUNTER — Ambulatory Visit: Payer: Medicare Other | Admitting: Internal Medicine

## 2018-04-19 ENCOUNTER — Encounter

## 2018-04-25 DIAGNOSIS — C61 Malignant neoplasm of prostate: Secondary | ICD-10-CM | POA: Diagnosis not present

## 2018-04-25 DIAGNOSIS — R350 Frequency of micturition: Secondary | ICD-10-CM | POA: Diagnosis not present

## 2018-04-28 ENCOUNTER — Other Ambulatory Visit: Payer: Self-pay | Admitting: Internal Medicine

## 2018-05-02 ENCOUNTER — Other Ambulatory Visit: Payer: Self-pay | Admitting: Internal Medicine

## 2018-05-23 ENCOUNTER — Other Ambulatory Visit: Payer: Self-pay | Admitting: Internal Medicine

## 2018-05-23 NOTE — Telephone Encounter (Signed)
[place] Controlled Substance Database checked. Last filled on 04/11/18 

## 2018-07-03 ENCOUNTER — Other Ambulatory Visit: Payer: Self-pay | Admitting: Internal Medicine

## 2018-07-04 NOTE — Telephone Encounter (Signed)
[place] Controlled Substance Database checked. Last filled on 0701/19 

## 2018-08-06 ENCOUNTER — Other Ambulatory Visit: Payer: Self-pay | Admitting: Internal Medicine

## 2018-08-10 ENCOUNTER — Other Ambulatory Visit: Payer: Self-pay | Admitting: Internal Medicine

## 2018-08-11 NOTE — Telephone Encounter (Signed)
[place] Controlled Substance Database checked. Last filled on  07/04/18  Last OV 02/14/18 Next OV 08/22/18

## 2018-08-20 NOTE — Progress Notes (Signed)
Subjective:    Patient ID: Steven Matthews, male    DOB: [birth year], 84 y.o.   MRN: [id number]  HPI The patient is here for follow up.  He is here with his niece who provides the history.    Dry cough:  He still has a chronic dry cough.  He denies sob, wheeze and fevers.    Hypertension: He is taking his medication daily. He is compliant with a low sodium diet.  He denies chest pain, palpitations, edema, shortness of breath and regular headaches. He is not exercising regularly.  He does not monitor his blood pressure at home.    Hypothyroidism:  He is taking his medication daily.  He denies any recent changes in energy or weight that are unexplained.   Hyperlipidemia: He is taking his medication daily. He is compliant with a low fat/cholesterol diet. He is not exercising regularly. He denies myalgias.   GERD:  He is taking his medication daily as prescribed.  He denies any GERD symptoms and feels his GERD is well controlled.   Prediabetes:  He is somewhat compliant with a low sugar/carbohydrate diet.  He is not exercising regularly.  Anxiety: He is taking his xanax daily as prescribed - his niece gives it to him. He denies any side effects from the medication. Him and his niece feel his anxiety is well controlled and he is happy with his current dose of medication.      Medications and allergies reviewed with patient and updated if appropriate.  Patient Active Problem List   Diagnosis Date Noted  . Prediabetes 08/19/2017  . GERD (gastroesophageal reflux disease) 02/15/2017  . Essential hypertension, benign 08/03/2016  . Hyperlipidemia 08/03/2016  . Hypothyroidism 08/03/2016  . History of prostate cancer 08/03/2016  . Overactive bladder 08/03/2016  . Intellectual disability 08/03/2016  . Anxiety 08/03/2016  . Nail hypertrophy 08/03/2016  . Chronic respiratory failure with hypoxia ([place]) 04/23/2016  . OSA (obstructive sleep apnea) 04/23/2016  . Upper airway cough syndrome  03/25/2016    Current Outpatient Medications on File Prior to Visit  Medication Sig Dispense Refill  . ALPRAZolam (XANAX) 0.25 MG tablet TAKE 1 TABLET BY MOUTH EVERY 6 HOURS AS NEEDED 40 tablet 0  . aspirin 81 MG tablet Take 81 mg by mouth every morning.     . irbesartan (AVAPRO) 75 MG tablet Take 1 tablet (75 mg total) by mouth daily. 90 tablet 1  . levothyroxine (SYNTHROID, LEVOTHROID) 75 MCG tablet TAKE 1 TABLET(75 MCG) BY MOUTH DAILY 90 tablet 1  . NONFORMULARY OR COMPOUNDED ITEM [facility]:  Onychomycosis Nail Lacquer - fluconazole 2%, Terbinafine 1%, DMSO, apply daily to affected area. 120 each 2  . pantoprazole (PROTONIX) 40 MG tablet TAKE 1 TABLET BY MOUTH TWICE DAILY 30 MINUTES BEFORE BREAKFAST AND DINNER 180 tablet 3  . pravastatin (PRAVACHOL) 80 MG tablet TAKE 1 TABLET(80 MG) BY MOUTH AT BEDTIME 90 tablet 1  . ranitidine (ZANTAC) 300 MG tablet TAKE 1 TABLET(300 MG) BY MOUTH AT BEDTIME 90 tablet 3  . tamsulosin (FLOMAX) 0.4 MG CAPS capsule TAKE 1 CAPSULE(0.4 MG) BY MOUTH DAILY AFTER SUPPER 90 capsule 2   No current facility-administered medications on file prior to visit.     Past Medical History:  Diagnosis Date  . Hyperlipidemia   . Hypertension   . Prostate cancer [facility])     Past Surgical History:  Procedure Laterality Date  . None      Social History   Socioeconomic History  .  Marital status: Single    Spouse name: Not on file  . Number of children: 0  . Years of education: Not on file  . Highest education level: Not on file  Occupational History  . Occupation: disabled  Social Needs  . Financial resource strain: Not on file  . Food insecurity:    Worry: Not on file    Inability: Not on file  . Transportation needs:    Medical: Not on file    Non-medical: Not on file  Tobacco Use  . Smoking status: Never Smoker  . Smokeless tobacco: Former Systems developer    Types: Snuff  Substance and Sexual Activity  . Alcohol use: No    Alcohol/week: 0.0 standard  drinks  . Drug use: No  . Sexual activity: Not on file  Lifestyle  . Physical activity:    Days per week: Not on file    Minutes per session: Not on file  . Stress: Not on file  Relationships  . Social connections:    Talks on phone: Not on file    Gets together: Not on file    Attends religious service: Not on file    Active member of club or organization: Not on file    Attends meetings of clubs or organizations: Not on file    Relationship status: Not on file  Other Topics Concern  . Not on file  Social History Narrative  . Not on file    Family History  Problem Relation Age of Onset  . Hypertension Mother   . Stroke Mother   . Colon cancer Neg Hx   . Esophageal cancer Neg Hx   . Stomach cancer Neg Hx   . Rectal cancer Neg Hx   . Liver cancer Neg Hx     Review of Systems  Constitutional: Negative for chills and fever.  Respiratory: Positive for cough (chronic). Negative for shortness of breath and wheezing.   Cardiovascular: Negative for chest pain, palpitations and leg swelling.  Gastrointestinal: Negative for abdominal pain and nausea.       No gerd  Neurological: Negative for light-headedness and headaches.       Objective:   Vitals:   08/22/18 1112  BP: (!) 142/82  Pulse: 73  Resp: 16  Temp: 98.1 F (36.7 C)  SpO2: 91%   BP Readings from Last 3 Encounters:  08/22/18 (!) 142/82  02/14/18 (!) 146/72  08/16/17 (!) 142/86   Wt Readings from Last 3 Encounters:  08/22/18 165 lb (74.8 kg)  02/14/18 158 lb (71.7 kg)  08/16/17 159 lb (72.1 kg)   Body mass index is 27.46 kg/m.   Physical Exam    Constitutional: Appears well-developed and well-nourished. No distress.  HENT:  Head: Normocephalic and atraumatic.  Neck: Neck supple. No tracheal deviation present. No thyromegaly present.  No cervical lymphadenopathy Cardiovascular: Normal rate, regular rhythm and normal heart sounds.   2/6 systolic murmur heard. No carotid bruit .  No  edema Pulmonary/Chest: Effort normal and breath sounds normal. No respiratory distress. No has no wheezes. No rales.  Skin: Skin is warm and dry. Not diaphoretic.  Psychiatric: Normal mood and affect. Behavior is normal.      Assessment & Plan:    See Problem List for Assessment and Plan of chronic medical problems.

## 2018-08-20 NOTE — Patient Instructions (Addendum)
  Tests ordered today. Your results will be released to [place] (or called to you) after review, usually within 72hours after test completion. If any changes need to be made, you will be notified at that same time.  Flu immunization administered today.    Medications reviewed and updated.  Changes include :   Stopping the avapro and restarting the amlodipine.    Your prescription(s) have been submitted to your pharmacy. Please take as directed and contact our office if you believe you are having problem(s) with the medication(s).   Please followup in 6 months

## 2018-08-22 ENCOUNTER — Encounter: Payer: Self-pay | Admitting: Internal Medicine

## 2018-08-22 ENCOUNTER — Other Ambulatory Visit (INDEPENDENT_AMBULATORY_CARE_PROVIDER_SITE_OTHER): Payer: Medicare Other

## 2018-08-22 ENCOUNTER — Ambulatory Visit (INDEPENDENT_AMBULATORY_CARE_PROVIDER_SITE_OTHER): Payer: Medicare Other | Admitting: Internal Medicine

## 2018-08-22 VITALS — BP 142/82 | HR 73 | Temp 98.1°F | Resp 16 | Ht 65.0 in | Wt 165.0 lb

## 2018-08-22 DIAGNOSIS — K219 Gastro-esophageal reflux disease without esophagitis: Secondary | ICD-10-CM

## 2018-08-22 DIAGNOSIS — Z23 Encounter for immunization: Secondary | ICD-10-CM

## 2018-08-22 DIAGNOSIS — E7849 Other hyperlipidemia: Secondary | ICD-10-CM

## 2018-08-22 DIAGNOSIS — I10 Essential (primary) hypertension: Secondary | ICD-10-CM

## 2018-08-22 DIAGNOSIS — R05 Cough: Secondary | ICD-10-CM | POA: Diagnosis not present

## 2018-08-22 DIAGNOSIS — E039 Hypothyroidism, unspecified: Secondary | ICD-10-CM

## 2018-08-22 DIAGNOSIS — R7303 Prediabetes: Secondary | ICD-10-CM

## 2018-08-22 DIAGNOSIS — R058 Other specified cough: Secondary | ICD-10-CM

## 2018-08-22 DIAGNOSIS — F419 Anxiety disorder, unspecified: Secondary | ICD-10-CM | POA: Diagnosis not present

## 2018-08-22 LAB — HEMOGLOBIN A1C: HEMOGLOBIN A1C: 6 % (ref 4.6–6.5)

## 2018-08-22 LAB — COMPREHENSIVE METABOLIC PANEL
ALT: 12 U/L (ref 0–53)
AST: 19 U/L (ref 0–37)
Albumin: 4.2 g/dL (ref 3.5–5.2)
Alkaline Phosphatase: 64 U/L (ref 39–117)
BILIRUBIN TOTAL: 0.6 mg/dL (ref 0.2–1.2)
BUN: 25 mg/dL — ABNORMAL HIGH (ref 6–23)
CO2: 30 mEq/L (ref 19–32)
Calcium: 9.4 mg/dL (ref 8.4–10.5)
Chloride: 102 mEq/L (ref 96–112)
Creatinine, Ser: 1.68 mg/dL — ABNORMAL HIGH (ref 0.40–1.50)
GFR: 50.12 mL/min — ABNORMAL LOW (ref >60.00)
Glucose, Bld: 119 mg/dL — ABNORMAL HIGH (ref 70–99)
POTASSIUM: 3.7 meq/L (ref 3.5–5.1)
SODIUM: 139 meq/L (ref 135–145)
Total Protein: 8.1 g/dL (ref 6.0–8.3)

## 2018-08-22 LAB — TSH: TSH: 2.14 u[IU]/mL (ref 0.35–4.50)

## 2018-08-22 MED ORDER — AMLODIPINE BESYLATE 10 MG PO TABS: ORAL_TABLET | ORAL | 3 refills | Status: DC

## 2018-08-22 NOTE — Assessment & Plan Note (Signed)
GERD controlled Continue daily medication  

## 2018-08-22 NOTE — Assessment & Plan Note (Signed)
Clinically euthyroid Check tsh  Titrate med dose if needed  

## 2018-08-22 NOTE — Assessment & Plan Note (Signed)
Check a1c Low sugar / carb diet   

## 2018-08-22 NOTE — Assessment & Plan Note (Signed)
Check lipid panel  Continue daily statin  healthy diet encouraged  

## 2018-08-22 NOTE — Assessment & Plan Note (Signed)
Controlled, stable Continue current dose of medication - xanax daily and as needed

## 2018-08-22 NOTE — Assessment & Plan Note (Signed)
Has seen pulmonary in the past Dry cough - ? From avapro - will d/c and restart amlodipine Continue zantac/PPI

## 2018-08-22 NOTE — Assessment & Plan Note (Signed)
BP elevated and has a chronic cough that may be related to the avapro D/c avapro Restart amlodipine 10 mg daily cmp

## 2018-08-23 ENCOUNTER — Encounter: Payer: Self-pay | Admitting: Internal Medicine

## 2018-09-07 ENCOUNTER — Other Ambulatory Visit: Payer: Self-pay | Admitting: Internal Medicine

## 2018-09-23 ENCOUNTER — Other Ambulatory Visit: Payer: Self-pay | Admitting: Internal Medicine

## 2018-09-26 NOTE — Telephone Encounter (Signed)
Last refill was 08/11/18 Last OV was 08/22/18 Next OV is 02/20/19

## 2018-09-27 ENCOUNTER — Other Ambulatory Visit: Payer: Self-pay

## 2018-09-27 NOTE — Telephone Encounter (Signed)
Last refill was 08/11/18 Last OV was 08/22/18 Next OV is 02/20/19

## 2018-10-02 ENCOUNTER — Other Ambulatory Visit: Payer: Self-pay | Admitting: Internal Medicine

## 2018-10-21 ENCOUNTER — Other Ambulatory Visit: Payer: Self-pay | Admitting: Internal Medicine

## 2018-11-02 ENCOUNTER — Other Ambulatory Visit: Payer: Self-pay | Admitting: Internal Medicine

## 2018-11-03 ENCOUNTER — Other Ambulatory Visit: Payer: Self-pay | Admitting: Internal Medicine

## 2018-11-03 MED ORDER — ALPRAZOLAM 0.25 MG PO TABS: 0.2500 mg | ORAL_TABLET | Freq: Four times a day (QID) | ORAL | 0 refills | Status: DC | PRN

## 2018-11-03 NOTE — Telephone Encounter (Signed)
Last refill was 09/26/18 Last OV was 08/22/18 Next OV 02/20/19

## 2018-12-16 ENCOUNTER — Other Ambulatory Visit: Payer: Self-pay | Admitting: Internal Medicine

## 2018-12-16 NOTE — Telephone Encounter (Signed)
Last refill was 11/03/18 Last OV was 08/22/18 Next OV 02/20/19

## 2019-01-01 ENCOUNTER — Other Ambulatory Visit: Payer: Self-pay | Admitting: Internal Medicine

## 2019-02-06 ENCOUNTER — Other Ambulatory Visit: Payer: Self-pay | Admitting: Internal Medicine

## 2019-02-19 NOTE — Progress Notes (Signed)
Virtual Visit via Video Note  I connected with Steven Matthews on 02/19/19 at 11:15 AM EDT by a video enabled telemedicine application and verified that I am speaking with the correct person using two identifiers.   I discussed the limitations of evaluation and management by telemedicine and the availability of in person appointments. The patient expressed understanding and agreed to proceed.  The patient is currently at home and I am in the office.    No referring provider.    History of Present Illness: He is here for follow up of his chronic medical conditions.    He is not exercising regularly.  Dry cough: He continues to have a chronic, dry cough.  It does tend to be worse when he lies down at night.  He feels his GERD is well controlled.  He denies any fevers, shortness of breath and wheezing.  We did try to discontinue his Avapro and change and to amlodipine and there was no change in the cough.  He has seen ENT and pulmonary in the past.  Hypertension: He is taking his medication daily. He is compliant with a low sodium diet.  He denies chest pain, palpitations, edema, shortness of breath and regular headaches.      Hypothyroidism:  He is taking his medication daily.  He denies any recent changes in energy or weight that are unexplained.   Hyperlipidemia: He is taking his medication daily. He is compliant with a low fat/cholesterol diet. He denies myalgias.   GERD:  He is taking his medication daily as prescribed.  He denies any GERD symptoms and feels his GERD is well controlled.   Prediabetes:  He is fairly compliant with a low sugar/carbohydrate diet.  His niece does most of the cooking and most of his meals are well balanced.  On occasion he does have sweet, but she make sure he does not overdo it.  He is not exercising regularly.  Anxiety: He is taking his medication xanax daily as prescribed. He denies any side effects from the medication. He feels his anxiety is well controlled  and he is happy with his current dose of medication.    ROS: He is a chronic dry cough that is unchanged.  It does tend to be worse at night.  He denies any fever, chills, shortness of breath, wheeze, chest pain, palpitations, lower extremity edema, headaches and lightheadedness.   Observations/Objective: He appears well and is in NAD  Lab Results  Component Value Date   WBC 4.9 02/14/2018   HGB 14.0 02/14/2018   HCT 41.2 02/14/2018   PLT 231.0 02/14/2018   GLUCOSE 119 (H) 08/22/2018   CHOL 149 02/14/2018   TRIG 119.0 02/14/2018   HDL 52.50 02/14/2018   LDLCALC 73 02/14/2018   ALT 12 08/22/2018   AST 19 08/22/2018   NA 139 08/22/2018   K 3.7 08/22/2018   CL 102 08/22/2018   CREATININE 1.68 (H) 08/22/2018   BUN 25 (H) 08/22/2018   CO2 30 08/22/2018   TSH 2.14 08/22/2018   HGBA1C 6.0 08/22/2018    Assessment and Plan:  Refills sent to pharmacy.  See Problem List for Assessment and Plan of chronic medical problems.   Follow Up Instructions:    I discussed the assessment and treatment plan with the patient. The patient was provided an opportunity to ask questions and all were answered. The patient agreed with the plan and demonstrated an understanding of the instructions.   The patient was advised to call  back or seek an in-person evaluation if the symptoms worsen or if the condition fails to improve as anticipated.  He will follow-up in 6 months, sooner if needed   Binnie Rail, MD

## 2019-02-20 ENCOUNTER — Ambulatory Visit (INDEPENDENT_AMBULATORY_CARE_PROVIDER_SITE_OTHER): Payer: Medicare Other | Admitting: Internal Medicine

## 2019-02-20 ENCOUNTER — Encounter: Payer: Self-pay | Admitting: Internal Medicine

## 2019-02-20 DIAGNOSIS — E7849 Other hyperlipidemia: Secondary | ICD-10-CM | POA: Diagnosis not present

## 2019-02-20 DIAGNOSIS — F419 Anxiety disorder, unspecified: Secondary | ICD-10-CM | POA: Diagnosis not present

## 2019-02-20 DIAGNOSIS — R05 Cough: Secondary | ICD-10-CM | POA: Diagnosis not present

## 2019-02-20 DIAGNOSIS — I10 Essential (primary) hypertension: Secondary | ICD-10-CM

## 2019-02-20 DIAGNOSIS — K219 Gastro-esophageal reflux disease without esophagitis: Secondary | ICD-10-CM

## 2019-02-20 DIAGNOSIS — R7303 Prediabetes: Secondary | ICD-10-CM

## 2019-02-20 DIAGNOSIS — E039 Hypothyroidism, unspecified: Secondary | ICD-10-CM | POA: Diagnosis not present

## 2019-02-20 DIAGNOSIS — R058 Other specified cough: Secondary | ICD-10-CM

## 2019-02-20 MED ORDER — PRAVASTATIN SODIUM 80 MG PO TABS: ORAL_TABLET | ORAL | 1 refills | Status: DC

## 2019-02-20 MED ORDER — TAMSULOSIN HCL 0.4 MG PO CAPS: ORAL_CAPSULE | ORAL | 2 refills | Status: DC

## 2019-02-20 MED ORDER — RANITIDINE HCL 300 MG PO TABS: ORAL_TABLET | ORAL | 1 refills | Status: DC

## 2019-02-20 MED ORDER — PANTOPRAZOLE SODIUM 40 MG PO TBEC: DELAYED_RELEASE_TABLET | ORAL | 1 refills | Status: DC

## 2019-02-20 MED ORDER — AMLODIPINE BESYLATE 10 MG PO TABS: ORAL_TABLET | ORAL | 0 refills | Status: DC

## 2019-02-20 MED ORDER — ALPRAZOLAM 0.25 MG PO TABS: ORAL_TABLET | ORAL | 2 refills | Status: DC

## 2019-02-20 NOTE — Assessment & Plan Note (Signed)
GERD controlled Continue daily medication  

## 2019-02-20 NOTE — Assessment & Plan Note (Signed)
Lab Results  Component Value Date   HGBA1C 6.0 08/22/2018   Sugars have been well controlled Continue low sugar/carbohydrate diet Follow-up in 6 months

## 2019-02-20 NOTE — Assessment & Plan Note (Signed)
Thyroid dose has been stable We will hold off on rechecking his TSH at this time and we will recheck it in 6 months with his next appointment Clinically euthyroid

## 2019-02-20 NOTE — Assessment & Plan Note (Signed)
BP Readings from Last 3 Encounters:  08/22/18 (!) 142/82  02/14/18 (!) 146/72  08/16/17 (!) 142/86   Blood pressure has been very stable with his last visits Continue current medication Follow-up in 6 months, sooner if needed

## 2019-02-20 NOTE — Assessment & Plan Note (Signed)
Anxiety controlled with Xanax Continue current dose

## 2019-02-20 NOTE — Assessment & Plan Note (Signed)
Continue pravastatin at current dose We will recheck lipid panel, CMP at his next visit

## 2019-02-20 NOTE — Assessment & Plan Note (Signed)
Chronic, unchanged No improvement with discontinuing Avapro Has seen pulmonary in the past At this point we will just monitor

## 2019-02-26 ENCOUNTER — Encounter: Payer: Self-pay | Admitting: Internal Medicine

## 2019-02-28 ENCOUNTER — Encounter: Payer: Self-pay | Admitting: Internal Medicine

## 2019-06-30 ENCOUNTER — Other Ambulatory Visit: Payer: Self-pay | Admitting: Internal Medicine

## 2019-07-02 ENCOUNTER — Other Ambulatory Visit: Payer: Self-pay | Admitting: Internal Medicine

## 2019-07-05 ENCOUNTER — Other Ambulatory Visit: Payer: Self-pay | Admitting: Internal Medicine

## 2019-07-07 ENCOUNTER — Other Ambulatory Visit: Payer: Self-pay | Admitting: Internal Medicine

## 2019-08-07 ENCOUNTER — Other Ambulatory Visit: Payer: Self-pay | Admitting: Internal Medicine

## 2019-08-08 NOTE — Telephone Encounter (Signed)
Last RF 07/02/19 Last OV 02/20/19 Next OV NA

## 2019-08-18 ENCOUNTER — Other Ambulatory Visit: Payer: Self-pay | Admitting: Internal Medicine

## 2019-08-21 ENCOUNTER — Other Ambulatory Visit: Payer: Self-pay | Admitting: Internal Medicine

## 2019-09-09 ENCOUNTER — Other Ambulatory Visit: Payer: Self-pay | Admitting: Internal Medicine

## 2019-09-11 ENCOUNTER — Other Ambulatory Visit: Payer: Self-pay | Admitting: Internal Medicine

## 2019-09-11 NOTE — Telephone Encounter (Signed)
Last OV 02/20/19 Last RF 08/08/19

## 2019-09-17 ENCOUNTER — Other Ambulatory Visit: Payer: Self-pay | Admitting: Internal Medicine

## 2019-10-01 NOTE — Progress Notes (Signed)
Virtual Visit via Video Note  I connected with Steven Matthews on 10/02/19 at  1:30 PM EST by a video enabled telemedicine application and verified that I am speaking with the correct person using two identifiers.   I discussed the limitations of evaluation and management by telemedicine and the availability of in person appointments. The patient expressed understanding and agreed to proceed.  Present for the visit:  Myself, Dr Billey Gosling, Lorra Hals, Velva Harman who is his niece.  She lives and cares for him.     The patient and his niece are currently at home and I am in the office.    No referring provider.    History of Present Illness: He is here for follow up of his chronic medical conditions.     Hypertension: He is taking his medication daily. He is compliant with a low sodium diet.  He denies chest pain, palpitations, edema, shortness of breath and regular headaches.  He does not monitor his blood pressure at home.    Hypothyroidism:  He is taking his medication daily.  He denies any recent changes in energy or weight that are unexplained.   Hyperlipidemia: He is taking his medication daily. He is compliant with a low fat/cholesterol diet. He denies myalgias.   GERD:  He is taking his medication daily as prescribed.  He denies any GERD symptoms and feels his GERD is well controlled.   Prediabetes:  He is compliant with a low sugar/carbohydrate diet.  He is exercising regularly.  Anxiety: He is taking his medication daily as prescribed. He denies any side effects from the medication. He feels his anxiety is well controlled and he is happy with his current dose of medication.     Review of Systems  Constitutional: Negative for chills, fever and malaise/fatigue.       No change in weight  Respiratory: Positive for cough (chronic, unchanged). Negative for shortness of breath and wheezing.   Cardiovascular: Negative for chest pain, palpitations and leg swelling.  Gastrointestinal:  Negative for abdominal pain and heartburn.  Neurological: Negative for dizziness and headaches.      Social History   Socioeconomic History  . Marital status: Single    Spouse name: Not on file  . Number of children: 0  . Years of education: Not on file  . Highest education level: Not on file  Occupational History  . Occupation: disabled  Social Needs  . Financial resource strain: Not on file  . Food insecurity    Worry: Not on file    Inability: Not on file  . Transportation needs    Medical: Not on file    Non-medical: Not on file  Tobacco Use  . Smoking status: Never Smoker  . Smokeless tobacco: Former Systems developer    Types: Snuff  Substance and Sexual Activity  . Alcohol use: No    Alcohol/week: 0.0 standard drinks  . Drug use: No  . Sexual activity: Not on file  Lifestyle  . Physical activity    Days per week: Not on file    Minutes per session: Not on file  . Stress: Not on file  Relationships  . Social Herbalist on phone: Not on file    Gets together: Not on file    Attends religious service: Not on file    Active member of club or organization: Not on file    Attends meetings of clubs or organizations: Not on file    Relationship status:  Not on file  Other Topics Concern  . Not on file  Social History Narrative  . Not on file     Observations/Objective: Appears well in NAD Breathing normally Skin warm and dry Mood and affect normal  Assessment and Plan:  See Problem List for Assessment and Plan of chronic medical problems.   Follow Up Instructions:    I discussed the assessment and treatment plan with the patient. The patient was provided an opportunity to ask questions and all were answered. The patient agreed with the plan and demonstrated an understanding of the instructions.   The patient was advised to call back or seek an in-person evaluation if the symptoms worsen or if the condition fails to improve as anticipated.  Follow-up in  6 months   Binnie Rail, MD

## 2019-10-02 ENCOUNTER — Encounter: Payer: Self-pay | Admitting: Internal Medicine

## 2019-10-02 ENCOUNTER — Ambulatory Visit (INDEPENDENT_AMBULATORY_CARE_PROVIDER_SITE_OTHER): Payer: Medicare Other | Admitting: Internal Medicine

## 2019-10-02 DIAGNOSIS — E039 Hypothyroidism, unspecified: Secondary | ICD-10-CM

## 2019-10-02 DIAGNOSIS — I10 Essential (primary) hypertension: Secondary | ICD-10-CM | POA: Diagnosis not present

## 2019-10-02 DIAGNOSIS — R7303 Prediabetes: Secondary | ICD-10-CM | POA: Diagnosis not present

## 2019-10-02 DIAGNOSIS — E7849 Other hyperlipidemia: Secondary | ICD-10-CM | POA: Diagnosis not present

## 2019-10-02 DIAGNOSIS — K219 Gastro-esophageal reflux disease without esophagitis: Secondary | ICD-10-CM | POA: Diagnosis not present

## 2019-10-02 DIAGNOSIS — F419 Anxiety disorder, unspecified: Secondary | ICD-10-CM | POA: Diagnosis not present

## 2019-10-02 MED ORDER — TAMSULOSIN HCL 0.4 MG PO CAPS: ORAL_CAPSULE | ORAL | 2 refills | Status: DC

## 2019-10-02 MED ORDER — PANTOPRAZOLE SODIUM 40 MG PO TBEC: DELAYED_RELEASE_TABLET | ORAL | 1 refills | Status: DC

## 2019-10-02 MED ORDER — PRAVASTATIN SODIUM 80 MG PO TABS: ORAL_TABLET | ORAL | 1 refills | Status: DC

## 2019-10-02 MED ORDER — LEVOTHYROXINE SODIUM 75 MCG PO TABS: ORAL_TABLET | ORAL | 1 refills | Status: DC

## 2019-10-02 MED ORDER — AMLODIPINE BESYLATE 10 MG PO TABS: ORAL_TABLET | ORAL | 1 refills | Status: DC

## 2019-10-02 NOTE — Assessment & Plan Note (Signed)
Check lipid panel  Continue daily statin  

## 2019-10-02 NOTE — Assessment & Plan Note (Signed)
Will continue amlodipine 10 mg daily Cmp, cbc

## 2019-10-02 NOTE — Assessment & Plan Note (Signed)
Anxiety overall stable and controlled Continue alprazolam as prescribed No need for refill at this time

## 2019-10-02 NOTE — Assessment & Plan Note (Signed)
Check a1c Low sugar / carb diet   

## 2019-10-02 NOTE — Assessment & Plan Note (Signed)
Clinically euthyroid Check tsh  Titrate med dose if needed  

## 2019-10-02 NOTE — Assessment & Plan Note (Signed)
GERD controlled Continue daily medication  

## 2019-10-03 ENCOUNTER — Other Ambulatory Visit (INDEPENDENT_AMBULATORY_CARE_PROVIDER_SITE_OTHER): Payer: Medicare Other

## 2019-10-03 ENCOUNTER — Other Ambulatory Visit: Payer: Self-pay | Admitting: Internal Medicine

## 2019-10-03 DIAGNOSIS — E7849 Other hyperlipidemia: Secondary | ICD-10-CM

## 2019-10-03 DIAGNOSIS — E039 Hypothyroidism, unspecified: Secondary | ICD-10-CM

## 2019-10-03 DIAGNOSIS — R7303 Prediabetes: Secondary | ICD-10-CM

## 2019-10-03 DIAGNOSIS — I10 Essential (primary) hypertension: Secondary | ICD-10-CM

## 2019-10-03 LAB — CBC WITH DIFFERENTIAL/PLATELET
Basophils Absolute: 0.1 10*3/uL (ref 0.0–0.1)
Basophils Relative: 1.2 % (ref 0.0–3.0)
Eosinophils Absolute: 0.2 10*3/uL (ref 0.0–0.7)
Eosinophils Relative: 3.7 % (ref 0.0–5.0)
HCT: 41.3 % (ref 39.0–52.0)
Hemoglobin: 13.4 g/dL (ref 13.0–17.0)
Lymphocytes Relative: 33.1 % (ref 12.0–46.0)
Lymphs Abs: 2.2 10*3/uL (ref 0.7–4.0)
MCHC: 32.5 g/dL (ref 30.0–36.0)
MCV: 87.2 fl (ref 78.0–100.0)
Monocytes Absolute: 0.5 10*3/uL (ref 0.1–1.0)
Monocytes Relative: 8 % (ref 3.0–12.0)
Neutro Abs: 3.6 10*3/uL (ref 1.4–7.7)
Neutrophils Relative %: 54 % (ref 43.0–77.0)
Platelets: 199 10*3/uL (ref 150.0–400.0)
RBC: 4.74 Mil/uL (ref 4.22–5.81)
RDW: 15.1 % (ref 11.5–15.5)
WBC: 6.6 10*3/uL (ref 4.0–10.5)

## 2019-10-03 LAB — COMPREHENSIVE METABOLIC PANEL
ALT: 13 U/L (ref 0–53)
AST: 17 U/L (ref 0–37)
Albumin: 4.2 g/dL (ref 3.5–5.2)
Alkaline Phosphatase: 68 U/L (ref 39–117)
BUN: 16 mg/dL (ref 6–23)
CO2: 28 mEq/L (ref 19–32)
Calcium: 9.4 mg/dL (ref 8.4–10.5)
Chloride: 104 mEq/L (ref 96–112)
Creatinine, Ser: 1.37 mg/dL (ref 0.40–1.50)
GFR: 59.52 mL/min — ABNORMAL LOW (ref >60.00)
Glucose, Bld: 98 mg/dL (ref 70–99)
Potassium: 3.6 mEq/L (ref 3.5–5.1)
Sodium: 139 mEq/L (ref 135–145)
Total Bilirubin: 0.7 mg/dL (ref 0.2–1.2)
Total Protein: 8 g/dL (ref 6.0–8.3)

## 2019-10-03 LAB — LIPID PANEL
Cholesterol: 182 mg/dL (ref 0–200)
HDL: 53.4 mg/dL (ref >39.00)
LDL Cholesterol: 101 mg/dL — ABNORMAL HIGH (ref 0–99)
NonHDL: 128.9
Total CHOL/HDL Ratio: 3
Triglycerides: 142 mg/dL (ref 0.0–149.0)
VLDL: 28.4 mg/dL (ref 0.0–40.0)

## 2019-10-03 LAB — HEMOGLOBIN A1C: Hgb A1c MFr Bld: 6 % (ref 4.6–6.5)

## 2019-10-03 LAB — TSH: TSH: 2.75 u[IU]/mL (ref 0.35–4.50)

## 2019-10-04 ENCOUNTER — Encounter: Payer: Self-pay | Admitting: Internal Medicine

## 2019-10-10 ENCOUNTER — Telehealth: Payer: Self-pay | Admitting: Internal Medicine

## 2019-10-10 NOTE — Telephone Encounter (Signed)
-----   Message from Binnie Rail, MD sent at 10/02/2019  1:53 PM EST ----- He needs a follow-up with me in 6 months.  He lives with his niece and you will need to arrange with her.

## 2019-10-10 NOTE — Telephone Encounter (Signed)
Left message for patient to call back to schedule.  °

## 2019-10-16 ENCOUNTER — Other Ambulatory Visit: Payer: Self-pay

## 2019-11-03 ENCOUNTER — Other Ambulatory Visit: Payer: Self-pay | Admitting: Internal Medicine

## 2019-11-06 ENCOUNTER — Other Ambulatory Visit: Payer: Self-pay | Admitting: Internal Medicine

## 2019-11-06 NOTE — Telephone Encounter (Signed)
[place] Controlled Database Checked Last filled: 09/12/19 # 10 LOV w/you: 10/02/19 Next appt w/you: None

## 2019-11-27 ENCOUNTER — Encounter: Payer: Self-pay | Admitting: Internal Medicine

## 2019-11-28 ENCOUNTER — Encounter: Payer: Self-pay | Admitting: Internal Medicine

## 2019-11-28 DIAGNOSIS — R32 Unspecified urinary incontinence: Secondary | ICD-10-CM

## 2019-12-11 ENCOUNTER — Other Ambulatory Visit: Payer: Self-pay | Admitting: Internal Medicine

## 2019-12-11 NOTE — Telephone Encounter (Signed)
Last OV was 10/02/19 Next OV NA Last RF 11/06/19

## 2020-01-22 DIAGNOSIS — C61 Malignant neoplasm of prostate: Secondary | ICD-10-CM | POA: Diagnosis not present

## 2020-01-22 DIAGNOSIS — R350 Frequency of micturition: Secondary | ICD-10-CM | POA: Diagnosis not present

## 2020-01-22 DIAGNOSIS — N3000 Acute cystitis without hematuria: Secondary | ICD-10-CM | POA: Diagnosis not present

## 2020-01-30 ENCOUNTER — Encounter: Payer: Self-pay | Admitting: Internal Medicine

## 2020-02-01 ENCOUNTER — Ambulatory Visit (HOSPITAL_COMMUNITY)
Admission: RE | Admit: 2020-02-01 | Discharge: 2020-02-01 | Disposition: A | Discharge status: Home / Self Care | Payer: Medicare Other | Source: Ambulatory Visit | Attending: Family | Admitting: Family

## 2020-02-01 ENCOUNTER — Encounter: Payer: Self-pay | Admitting: Family

## 2020-02-01 ENCOUNTER — Other Ambulatory Visit: Payer: Self-pay

## 2020-02-01 ENCOUNTER — Telehealth: Payer: Self-pay | Admitting: Family

## 2020-02-01 ENCOUNTER — Other Ambulatory Visit: Payer: Self-pay | Admitting: Internal Medicine

## 2020-02-01 ENCOUNTER — Ambulatory Visit: Payer: Medicare Other | Admitting: Family

## 2020-02-01 ENCOUNTER — Ambulatory Visit (INDEPENDENT_AMBULATORY_CARE_PROVIDER_SITE_OTHER): Payer: Medicare Other | Admitting: Family

## 2020-02-01 VITALS — BP 126/60 | HR 82 | Temp 99.4°F | Ht 65.0 in | Wt 157.8 lb

## 2020-02-01 DIAGNOSIS — R600 Localized edema: Secondary | ICD-10-CM | POA: Insufficient documentation

## 2020-02-01 LAB — COMPREHENSIVE METABOLIC PANEL
ALT: 84 U/L — ABNORMAL HIGH (ref 0–53)
AST: 59 U/L — ABNORMAL HIGH (ref 0–37)
Albumin: 3.4 g/dL — ABNORMAL LOW (ref 3.5–5.2)
Alkaline Phosphatase: 169 U/L — ABNORMAL HIGH (ref 39–117)
BUN: 25 mg/dL — ABNORMAL HIGH (ref 6–23)
CO2: 24 mEq/L (ref 19–32)
Calcium: 9.1 mg/dL (ref 8.4–10.5)
Chloride: 101 mEq/L (ref 96–112)
Creatinine, Ser: 2.14 mg/dL — ABNORMAL HIGH (ref 0.40–1.50)
GFR: 35.55 mL/min — ABNORMAL LOW (ref >60.00)
Glucose, Bld: 163 mg/dL — ABNORMAL HIGH (ref 70–99)
Potassium: 3.7 mEq/L (ref 3.5–5.1)
Sodium: 134 mEq/L — ABNORMAL LOW (ref 135–145)
Total Bilirubin: 0.9 mg/dL (ref 0.2–1.2)
Total Protein: 8.1 g/dL (ref 6.0–8.3)

## 2020-02-01 LAB — CBC WITH DIFFERENTIAL/PLATELET
Basophils Absolute: 0.1 10*3/uL (ref 0.0–0.1)
Basophils Relative: 0.6 % (ref 0.0–3.0)
Eosinophils Absolute: 0.1 10*3/uL (ref 0.0–0.7)
Eosinophils Relative: 1.2 % (ref 0.0–5.0)
HCT: 33.1 % — ABNORMAL LOW (ref 39.0–52.0)
Hemoglobin: 11 g/dL — ABNORMAL LOW (ref 13.0–17.0)
Lymphocytes Relative: 8.1 % — ABNORMAL LOW (ref 12.0–46.0)
Lymphs Abs: 0.9 10*3/uL (ref 0.7–4.0)
MCHC: 33.2 g/dL (ref 30.0–36.0)
MCV: 84.8 fl (ref 78.0–100.0)
Monocytes Absolute: 1 10*3/uL (ref 0.1–1.0)
Monocytes Relative: 8.3 % (ref 3.0–12.0)
Neutro Abs: 9.5 10*3/uL — ABNORMAL HIGH (ref 1.4–7.7)
Neutrophils Relative %: 81.8 % — ABNORMAL HIGH (ref 43.0–77.0)
Platelets: 279 10*3/uL (ref 150.0–400.0)
RBC: 3.9 Mil/uL — ABNORMAL LOW (ref 4.22–5.81)
RDW: 16.1 % — ABNORMAL HIGH (ref 11.5–15.5)
WBC: 11.6 10*3/uL — ABNORMAL HIGH (ref 4.0–10.5)

## 2020-02-01 MED ORDER — CEPHALEXIN 500 MG PO CAPS: 500.0000 mg | ORAL_CAPSULE | Freq: Three times a day (TID) | ORAL | 0 refills | Status: DC

## 2020-02-01 NOTE — Progress Notes (Signed)
VASCULAR LAB PRELIMINARY  PRELIMINARY  PRELIMINARY  PRELIMINARY  Right lower extremity venous duplex completed.    Preliminary report:  See CV proc for preliminary results.  Called report to [facility], Flannery Cavallero, RVT 02/01/2020, 4:10 PM

## 2020-02-01 NOTE — Progress Notes (Addendum)
Steven Matthews is a 86 y.o. male with the following history as recorded in EpicCare:  Patient Active Problem List   Diagnosis Date Noted  . Prediabetes 08/19/2017  . GERD (gastroesophageal reflux disease) 02/15/2017  . Essential hypertension, benign 08/03/2016  . Hyperlipidemia 08/03/2016  . Hypothyroidism 08/03/2016  . History of prostate cancer 08/03/2016  . Overactive bladder 08/03/2016  . Intellectual disability 08/03/2016  . Anxiety 08/03/2016  . Nail hypertrophy 08/03/2016  . Chronic respiratory failure with hypoxia ([place]) 04/23/2016  . OSA (obstructive sleep apnea) 04/23/2016  . Upper airway cough syndrome 03/25/2016    Current Outpatient Medications  Medication Sig Dispense Refill  . ALPRAZolam (XANAX) 0.25 MG tablet TAKE 1 TABLET(0.25 MG) BY MOUTH EVERY 6 HOURS AS NEEDED 60 tablet 0  . amLODipine (NORVASC) 10 MG tablet TAKE 1 TABLET(10 MG) BY MOUTH DAILY. 90 tablet 1  . aspirin 81 MG tablet Take 81 mg by mouth every morning.     Marland Kitchen levothyroxine (SYNTHROID) 75 MCG tablet Take 1 tablet by mouth daily. 90 tablet 1  . NONFORMULARY OR COMPOUNDED ITEM [facility]:  Onychomycosis Nail Lacquer - fluconazole 2%, Terbinafine 1%, DMSO, apply daily to affected area. 120 each 2  . pantoprazole (PROTONIX) 40 MG tablet TAKE 1 TABLET BY MOUTH TWICE DAILY 30 MINUTES BEFORE BREAKFAST AND DINNER 180 tablet 1  . pravastatin (PRAVACHOL) 80 MG tablet TAKE 1 TABLET(80 MG) BY MOUTH AT BEDTIME. 90 tablet 1  . sulfamethoxazole-trimethoprim (BACTRIM DS) 800-160 MG tablet Take 1 tablet by mouth 2 (two) times daily.    . tamsulosin (FLOMAX) 0.4 MG CAPS capsule TAKE 1 CAPSULE(0.4 MG) BY MOUTH DAILY AFTER SUPPER 90 capsule 2   No current facility-administered medications for this visit.    Allergies: Patient has no known allergies.  Past Medical History:  Diagnosis Date  . Hyperlipidemia   . Hypertension   . Prostate cancer [facility])     Past Surgical History:  Procedure Laterality Date  . None      Family History  Problem Relation Age of Onset  . Hypertension Mother   . Stroke Mother   . Colon cancer Neg Hx   . Esophageal cancer Neg Hx   . Stomach cancer Neg Hx   . Rectal cancer Neg Hx   . Liver cancer Neg Hx     Social History   Tobacco Use  . Smoking status: Never Smoker  . Smokeless tobacco: Former Systems developer    Types: Snuff  Substance Use Topics  . Alcohol use: No    Alcohol/week: 0.0 standard drinks    Subjective:  Patient is brought to the office by his niece; concerned about pain in lower right leg, red and tender to the touch; per niece, first noticed the area 2 days ago; was "extremely warm to the touch" yesterday; patient mentions that he fell and injured his leg but niece is not sure information is correct; she notes her uncle has chronic cough but has noticed he has been coughing a little more in the past 2 days;   Objective:  Vitals:   02/01/20 1401  BP: 126/60  Pulse: 82  Temp: 99.4 F (37.4 C)  TempSrc: Oral  SpO2: 95%  Weight: 157 lb 12.8 oz (71.6 kg)  Height: _0  (1.651 m)    General: Well developed, well nourished, in no acute distress  Head: Normocephalic and atraumatic  Lungs: Respirations unlabored;  CVS exam: normal rate and regular rhythm.  Musculoskeletal: No deformities; no active joint inflammation  Extremities:  No edema, cyanosis, clubbing; localized area of erythema noted over lower right extremity Vessels: Symmetric bilaterally  Neurologic: Alert and oriented; speech intact; face symmetrical; moves all extremities well; CNII-XII intact without focal deficit   Assessment:  1. Pedal edema     Plan:   ? DVT vs cellulitis; stat venous doppler is scheduled for today; check CBC, CMP, D-dimer today; follow-up to be determined; if no clot is found, will start antibiotics and plan for re-check early next week. Doppler was negative so will treat for cellulitis; Rx for Keflex 500 mg tid x 7 days;   This visit occurred during the SARS-CoV-2  public health emergency.  Safety protocols were in place, including screening questions prior to the visit, additional usage of staff PPE, and extensive cleaning of exam room while observing appropriate contact time as indicated for disinfecting solutions.    No follow-ups on file.  Orders Placed This Encounter  Procedures  . D-Dimer, Quantitative  . CBC with Differential/Platelet  . Comp Met (CMET)    Requested Prescriptions    No prescriptions requested or ordered in this encounter

## 2020-02-01 NOTE — Telephone Encounter (Signed)
Spoke with niece and info given.

## 2020-02-01 NOTE — Addendum Note (Signed)
Addended by: Sherlene Shams on: 02/01/2020 04:03 PM   Modules accepted: Orders

## 2020-02-01 NOTE — Telephone Encounter (Signed)
Please let Steven Matthews know that doppler was negative for DVT; will treat for cellulitis; would like to re-check the leg early next week ( with me or Dr. Quay Burow okay); labs are pending and will be in touch with results.

## 2020-02-02 LAB — D-DIMER, QUANTITATIVE: D-Dimer, Quant: 5.9 mcg/mL FEU — ABNORMAL HIGH (ref <0.50)

## 2020-02-05 ENCOUNTER — Other Ambulatory Visit: Payer: Self-pay | Admitting: Family

## 2020-02-05 DIAGNOSIS — R079 Chest pain, unspecified: Secondary | ICD-10-CM

## 2020-02-05 NOTE — Progress Notes (Signed)
Subjective:    Patient ID: Steven Matthews, male    DOB: Mar 23, 1931, 86 y.o.   MRN: [id number]  HPI The patient is here for an acute visit.   Right ankle swelling - he was seen 3/11 for pedal edema.  The RLE was red, tender and swollen.  An [place] was ordered and a DVT was ruled out.  D-dimer was elevated, so CT of the chest was ordered.  WBC was elevated and CKD was worse.  He was started on keflex TID x 7 days.  He is here today for follow-up.  His niece is with him who provides the history secondary to his dementia.  Right lower extremity still has some redness and is still painful.  There has been improvement, maybe 50%.  There is no right lower extremity edema.  He is taking the antibiotic as prescribed.  There are no fevers or chills.      Medications and allergies reviewed with patient and updated if appropriate.  Patient Active Problem List   Diagnosis Date Noted  . Prediabetes 08/19/2017  . GERD (gastroesophageal reflux disease) 02/15/2017  . Essential hypertension, benign 08/03/2016  . Hyperlipidemia 08/03/2016  . Hypothyroidism 08/03/2016  . History of prostate cancer 08/03/2016  . Overactive bladder 08/03/2016  . Intellectual disability 08/03/2016  . Anxiety 08/03/2016  . Nail hypertrophy 08/03/2016  . Chronic respiratory failure with hypoxia ([place]) 04/23/2016  . OSA (obstructive sleep apnea) 04/23/2016  . Upper airway cough syndrome 03/25/2016    Current Outpatient Medications on File Prior to Visit  Medication Sig Dispense Refill  . ALPRAZolam (XANAX) 0.25 MG tablet TAKE 1 TABLET(0.25 MG) BY MOUTH EVERY 6 HOURS AS NEEDED 60 tablet 0  . amLODipine (NORVASC) 10 MG tablet TAKE 1 TABLET(10 MG) BY MOUTH DAILY. 90 tablet 1  . aspirin 81 MG tablet Take 81 mg by mouth every morning.     . cephALEXin (KEFLEX) 500 MG capsule Take 1 capsule (500 mg total) by mouth 3 (three) times daily. 21 capsule 0  . levothyroxine (SYNTHROID) 75 MCG tablet Take 1 tablet by mouth daily. 90  tablet 1  . NONFORMULARY OR COMPOUNDED ITEM [facility]:  Onychomycosis Nail Lacquer - fluconazole 2%, Terbinafine 1%, DMSO, apply daily to affected area. 120 each 2  . pantoprazole (PROTONIX) 40 MG tablet TAKE 1 TABLET BY MOUTH TWICE DAILY 30 MINUTES BEFORE BREAKFAST AND DINNER 180 tablet 1  . pravastatin (PRAVACHOL) 80 MG tablet TAKE 1 TABLET(80 MG) BY MOUTH AT BEDTIME. 90 tablet 1  . sulfamethoxazole-trimethoprim (BACTRIM DS) 800-160 MG tablet Take 1 tablet by mouth 2 (two) times daily.    . tamsulosin (FLOMAX) 0.4 MG CAPS capsule TAKE 1 CAPSULE(0.4 MG) BY MOUTH DAILY AFTER SUPPER 90 capsule 2   No current facility-administered medications on file prior to visit.    Past Medical History:  Diagnosis Date  . Hyperlipidemia   . Hypertension   . Prostate cancer [facility])     Past Surgical History:  Procedure Laterality Date  . None      Social History   Socioeconomic History  . Marital status: Single    Spouse name: Not on file  . Number of children: 0  . Years of education: Not on file  . Highest education level: Not on file  Occupational History  . Occupation: disabled  Tobacco Use  . Smoking status: Never Smoker  . Smokeless tobacco: Former Systems developer    Types: Snuff  Substance and Sexual Activity  . Alcohol use: No  Alcohol/week: 0.0 standard drinks  . Drug use: No  . Sexual activity: Not on file  Other Topics Concern  . Not on file  Social History Narrative  . Not on file   Social Determinants of Health   Financial Resource Strain:   . Difficulty of Paying Living Expenses:   Food Insecurity:   . Worried About Charity fundraiser in the Last Year:   . Arboriculturist in the Last Year:   Transportation Needs:   . Film/video editor (Medical):   Marland Kitchen Lack of Transportation (Non-Medical):   Physical Activity:   . Days of Exercise per Week:   . Minutes of Exercise per Session:   Stress:   . Feeling of Stress :   Social Connections:   . Frequency of  Communication with Friends and Family:   . Frequency of Social Gatherings with Friends and Family:   . Attends Religious Services:   . Active Member of Clubs or Organizations:   . Attends Archivist Meetings:   Marland Kitchen Marital Status:     Family History  Problem Relation Age of Onset  . Hypertension Mother   . Stroke Mother   . Colon cancer Neg Hx   . Esophageal cancer Neg Hx   . Stomach cancer Neg Hx   . Rectal cancer Neg Hx   . Liver cancer Neg Hx     Review of Systems  Constitutional: Negative for chills and fever.  Respiratory: Positive for shortness of breath (at times with exertion, which is chronic and not new). Negative for cough and wheezing.   Cardiovascular: Negative for chest pain, palpitations and leg swelling.  Skin: Positive for color change.  Neurological: Negative for light-headedness and headaches.       Objective:   Vitals:   02/06/20 1425  BP: (!) 142/70  Pulse: 90  Resp: 16  Temp: 98.2 F (36.8 C)  SpO2: 93%   BP Readings from Last 3 Encounters:  02/06/20 (!) 142/70  02/01/20 126/60  08/22/18 (!) 142/82   Wt Readings from Last 3 Encounters:  02/06/20 154 lb (69.9 kg)  02/01/20 157 lb 12.8 oz (71.6 kg)  08/22/18 165 lb (74.8 kg)   Body mass index is 25.63 kg/m.   Physical Exam Constitutional:      General: He is not in acute distress.    Appearance: Normal appearance. He is not ill-appearing.  Pulmonary:     Effort: Pulmonary effort is normal.  Skin:    Comments: Right distal lower extremity with erythema, mild tenderness and warmth.  There are couple of scabs that could have been related to a prior recent injury.  No calf tenderness.  No open wounds.  Neurological:     Mental Status: He is alert.            Assessment & Plan:    See Problem List for Assessment and Plan of chronic medical problems.    This visit occurred during the SARS-CoV-2 public health emergency.  Safety protocols were in place, including  screening questions prior to the visit, additional usage of staff PPE, and extensive cleaning of exam room while observing appropriate contact time as indicated for disinfecting solutions.

## 2020-02-05 NOTE — Telephone Encounter (Signed)
Last RF 12/11/19 Last OV 10/02/19 Next OV 02/06/20

## 2020-02-05 NOTE — Patient Instructions (Addendum)
You still have a skin infection and you need an additional antibiotic.    Medications reviewed and updated.  Changes include :   Continue the cephalexin and complete that.  Start doxycycline twice daily x 10 days.   Your prescription(s) have been submitted to your pharmacy. Please take as directed and contact our office if you believe you are having problem(s) with the medication(s).   Please call if there is no improvement in your symptoms.

## 2020-02-06 ENCOUNTER — Encounter: Payer: Self-pay | Admitting: Internal Medicine

## 2020-02-06 ENCOUNTER — Ambulatory Visit (INDEPENDENT_AMBULATORY_CARE_PROVIDER_SITE_OTHER): Payer: Medicare Other | Admitting: Internal Medicine

## 2020-02-06 ENCOUNTER — Other Ambulatory Visit: Payer: Self-pay

## 2020-02-06 VITALS — BP 142/70 | HR 90 | Temp 98.2°F | Resp 16 | Ht 65.0 in | Wt 154.0 lb

## 2020-02-06 DIAGNOSIS — L03115 Cellulitis of right lower limb: Secondary | ICD-10-CM

## 2020-02-06 DIAGNOSIS — L0390 Cellulitis, unspecified: Secondary | ICD-10-CM | POA: Insufficient documentation

## 2020-02-06 DIAGNOSIS — D72829 Elevated white blood cell count, unspecified: Secondary | ICD-10-CM

## 2020-02-06 DIAGNOSIS — R7989 Other specified abnormal findings of blood chemistry: Secondary | ICD-10-CM | POA: Diagnosis not present

## 2020-02-06 MED ORDER — DOXYCYCLINE HYCLATE 100 MG PO TABS: 100.0000 mg | ORAL_TABLET | Freq: Two times a day (BID) | ORAL | 0 refills | Status: DC

## 2020-02-06 NOTE — Assessment & Plan Note (Signed)
Related to cellulitis Repeat CBC next week

## 2020-02-06 NOTE — Assessment & Plan Note (Signed)
D-dimer elevated last week Ultrasound of the lower extremity showed no RLE DVT CT angio of chest ordered to rule out possible PE because of complaints of shortness of breath and possibly chest discomfort Upon further questioning the shortness of breath is intermittent and chronic there has been no change.  There is no true chest discomfort.  Vital signs look good Very low suspicion of PE Cellulitis would explain elevated D-dimer so given his kidney function and low risk we will go ahead and cancel the CT angio of the chest We will repeat the D-dimer, CBC and CMP next week Discussed with his niece that if there are any concerning symptoms we can always consider getting a CT again

## 2020-02-06 NOTE — Assessment & Plan Note (Signed)
Right lower extremity cellulitis-improved since starting on Keflex from 3/11 Still has significant infection we will complete the Keflex, but also start doxycycline twice daily His niece will monitor and update me if there is not improvement We will repeat his abnormal labs next week DVT the lower extremity was ruled out last week with ultrasound

## 2020-02-12 ENCOUNTER — Other Ambulatory Visit: Payer: Medicare Other

## 2020-02-13 ENCOUNTER — Ambulatory Visit (INDEPENDENT_AMBULATORY_CARE_PROVIDER_SITE_OTHER): Payer: Medicare Other | Admitting: Podiatry

## 2020-02-13 ENCOUNTER — Encounter: Payer: Self-pay | Admitting: Podiatry

## 2020-02-13 ENCOUNTER — Other Ambulatory Visit: Payer: Self-pay

## 2020-02-13 ENCOUNTER — Other Ambulatory Visit (INDEPENDENT_AMBULATORY_CARE_PROVIDER_SITE_OTHER): Payer: Medicare Other

## 2020-02-13 DIAGNOSIS — M79674 Pain in right toe(s): Secondary | ICD-10-CM | POA: Diagnosis not present

## 2020-02-13 DIAGNOSIS — D72829 Elevated white blood cell count, unspecified: Secondary | ICD-10-CM | POA: Diagnosis not present

## 2020-02-13 DIAGNOSIS — M79675 Pain in left toe(s): Secondary | ICD-10-CM | POA: Diagnosis not present

## 2020-02-13 DIAGNOSIS — R7989 Other specified abnormal findings of blood chemistry: Secondary | ICD-10-CM | POA: Diagnosis not present

## 2020-02-13 DIAGNOSIS — B351 Tinea unguium: Secondary | ICD-10-CM | POA: Diagnosis not present

## 2020-02-13 LAB — COMPREHENSIVE METABOLIC PANEL
ALT: 17 U/L (ref 0–53)
AST: 21 U/L (ref 0–37)
Albumin: 3.9 g/dL (ref 3.5–5.2)
Alkaline Phosphatase: 105 U/L (ref 39–117)
BUN: 19 mg/dL (ref 6–23)
CO2: 28 mEq/L (ref 19–32)
Calcium: 9.8 mg/dL (ref 8.4–10.5)
Chloride: 102 mEq/L (ref 96–112)
Creatinine, Ser: 1.51 mg/dL — ABNORMAL HIGH (ref 0.40–1.50)
GFR: 53.15 mL/min — ABNORMAL LOW (ref >60.00)
Glucose, Bld: 116 mg/dL — ABNORMAL HIGH (ref 70–99)
Potassium: 4 mEq/L (ref 3.5–5.1)
Sodium: 137 mEq/L (ref 135–145)
Total Bilirubin: 0.6 mg/dL (ref 0.2–1.2)
Total Protein: 8.2 g/dL (ref 6.0–8.3)

## 2020-02-13 LAB — CBC WITH DIFFERENTIAL/PLATELET
Basophils Absolute: 0.1 10*3/uL (ref 0.0–0.1)
Basophils Relative: 1.5 % (ref 0.0–3.0)
Eosinophils Absolute: 0.2 10*3/uL (ref 0.0–0.7)
Eosinophils Relative: 2.6 % (ref 0.0–5.0)
HCT: 36.5 % — ABNORMAL LOW (ref 39.0–52.0)
Hemoglobin: 12.1 g/dL — ABNORMAL LOW (ref 13.0–17.0)
Lymphocytes Relative: 20.9 % (ref 12.0–46.0)
Lymphs Abs: 1.3 10*3/uL (ref 0.7–4.0)
MCHC: 33 g/dL (ref 30.0–36.0)
MCV: 85.4 fl (ref 78.0–100.0)
Monocytes Absolute: 0.5 10*3/uL (ref 0.1–1.0)
Monocytes Relative: 7.4 % (ref 3.0–12.0)
Neutro Abs: 4.3 10*3/uL (ref 1.4–7.7)
Neutrophils Relative %: 67.6 % (ref 43.0–77.0)
Platelets: 372 10*3/uL (ref 150.0–400.0)
RBC: 4.28 Mil/uL (ref 4.22–5.81)
RDW: 16.4 % — ABNORMAL HIGH (ref 11.5–15.5)
WBC: 6.4 10*3/uL (ref 4.0–10.5)

## 2020-02-14 ENCOUNTER — Other Ambulatory Visit: Payer: Medicare Other

## 2020-02-14 ENCOUNTER — Other Ambulatory Visit: Payer: Self-pay | Admitting: Internal Medicine

## 2020-02-14 DIAGNOSIS — R0602 Shortness of breath: Secondary | ICD-10-CM

## 2020-02-14 LAB — D-DIMER, QUANTITATIVE: D-Dimer, Quant: 12.37 mcg/mL FEU — ABNORMAL HIGH (ref <0.50)

## 2020-02-15 ENCOUNTER — Inpatient Hospital Stay: Admission: RE | Admit: 2020-02-15 | Payer: Medicare Other | Source: Ambulatory Visit

## 2020-02-18 NOTE — Progress Notes (Signed)
Subjective:   Patient ID: Steven Matthews, male   DOB: 86 y.o.   MRN: [id number]   HPI 86 year old male presents the office today for concerns of thick, painful, elongated to his that he cannot trim himself.  No redness or drainage to the toenail sites.  He was previously treated for cellulitis of his right leg and found to have a blood clot.  He had a wound that appears to be healed.  He has been under the care of another physician for this.   Review of Systems  All other systems reviewed and are negative.  Past Medical History:  Diagnosis Date  . Hyperlipidemia   . Hypertension   . Prostate cancer [facility])     Past Surgical History:  Procedure Laterality Date  . None       Current Outpatient Medications:  .  ALPRAZolam (XANAX) 0.25 MG tablet, TAKE 1 TABLET(0.25 MG) BY MOUTH EVERY 6 HOURS AS NEEDED, Disp: 60 tablet, Rfl: 0 .  amLODipine (NORVASC) 10 MG tablet, TAKE 1 TABLET(10 MG) BY MOUTH DAILY., Disp: 90 tablet, Rfl: 1 .  doxycycline (VIBRA-TABS) 100 MG tablet, Take 1 tablet (100 mg total) by mouth 2 (two) times daily., Disp: 20 tablet, Rfl: 0 .  levothyroxine (SYNTHROID) 75 MCG tablet, Take 1 tablet by mouth daily., Disp: 90 tablet, Rfl: 1 .  pantoprazole (PROTONIX) 40 MG tablet, TAKE 1 TABLET BY MOUTH TWICE DAILY 30 MINUTES BEFORE BREAKFAST AND DINNER, Disp: 180 tablet, Rfl: 1 .  pravastatin (PRAVACHOL) 80 MG tablet, TAKE 1 TABLET(80 MG) BY MOUTH AT BEDTIME., Disp: 90 tablet, Rfl: 1 .  tamsulosin (FLOMAX) 0.4 MG CAPS capsule, TAKE 1 CAPSULE(0.4 MG) BY MOUTH DAILY AFTER SUPPER, Disp: 90 capsule, Rfl: 2  No Known Allergies        Objective:  Physical Exam  General: NAD  Dermatological: Nails are hypertrophic, dystrophic, brittle, discolored, elongated 10. No surrounding redness or drainage. Tenderness nails 1-5 bilaterally. No open lesions or pre-ulcerative lesions are identified today.  Vascular: Dorsalis Pedis artery and Posterior Tibial artery pedal pulses are palpable  bilateral with immedate capillary fill time. There is no pain with calf compression, swelling, warmth, erythema.   Neruologic: Grossly intact via light touch bilateral.   Musculoskeletal: No gross boney pedal deformities bilateral. No pain, crepitus, or limitation noted with foot and ankle range of motion bilateral. Muscular strength 5/5 in all groups tested bilateral.  Gait: Unassisted, Nonantalgic.       Assessment:   Symptomatic onychomycosis    Plan:  -Treatment options discussed including all alternatives, risks, and complications -Etiology of symptoms were discussed -Nails debrided 10 without complications or bleeding. -Daily foot inspection -Follow-up in 3 months or sooner if any problems arise. In the meantime, encouraged to call the office with any questions, concerns, change in symptoms.   Celesta Gentile, DPM

## 2020-02-19 DIAGNOSIS — I7789 Other specified disorders of arteries and arterioles: Secondary | ICD-10-CM | POA: Diagnosis not present

## 2020-02-19 DIAGNOSIS — I517 Cardiomegaly: Secondary | ICD-10-CM | POA: Diagnosis not present

## 2020-02-19 DIAGNOSIS — R7989 Other specified abnormal findings of blood chemistry: Secondary | ICD-10-CM | POA: Diagnosis not present

## 2020-02-19 DIAGNOSIS — R911 Solitary pulmonary nodule: Secondary | ICD-10-CM | POA: Diagnosis not present

## 2020-02-19 DIAGNOSIS — J9811 Atelectasis: Secondary | ICD-10-CM | POA: Diagnosis not present

## 2020-02-20 ENCOUNTER — Encounter: Payer: Self-pay | Admitting: Internal Medicine

## 2020-02-20 DIAGNOSIS — R0609 Other forms of dyspnea: Secondary | ICD-10-CM

## 2020-02-21 ENCOUNTER — Encounter: Payer: Self-pay | Admitting: Internal Medicine

## 2020-02-23 ENCOUNTER — Other Ambulatory Visit: Payer: Self-pay

## 2020-02-23 ENCOUNTER — Ambulatory Visit (HOSPITAL_COMMUNITY): Payer: Medicare Other | Attending: Internal Medicine

## 2020-02-23 DIAGNOSIS — R0609 Other forms of dyspnea: Secondary | ICD-10-CM

## 2020-02-23 DIAGNOSIS — R0600 Dyspnea, unspecified: Secondary | ICD-10-CM | POA: Diagnosis not present

## 2020-02-25 ENCOUNTER — Encounter: Payer: Self-pay | Admitting: Internal Medicine

## 2020-03-28 ENCOUNTER — Other Ambulatory Visit: Payer: Self-pay | Admitting: Internal Medicine

## 2020-03-29 ENCOUNTER — Other Ambulatory Visit: Payer: Self-pay | Admitting: Internal Medicine

## 2020-04-04 ENCOUNTER — Other Ambulatory Visit: Payer: Self-pay

## 2020-04-04 ENCOUNTER — Encounter: Payer: Self-pay | Admitting: Internal Medicine

## 2020-04-04 ENCOUNTER — Other Ambulatory Visit: Payer: Self-pay | Admitting: Internal Medicine

## 2020-04-04 MED ORDER — AMLODIPINE BESYLATE 10 MG PO TABS: ORAL_TABLET | ORAL | 0 refills | Status: DC

## 2020-04-06 ENCOUNTER — Other Ambulatory Visit: Payer: Self-pay | Admitting: Internal Medicine

## 2020-04-08 ENCOUNTER — Other Ambulatory Visit: Payer: Self-pay | Admitting: Internal Medicine

## 2020-04-09 ENCOUNTER — Other Ambulatory Visit: Payer: Self-pay | Admitting: Internal Medicine

## 2020-04-10 NOTE — Telephone Encounter (Signed)
Last OV 10/02/19 Next OV NA Last OV 02/06/20

## 2020-05-06 ENCOUNTER — Other Ambulatory Visit: Payer: Self-pay | Admitting: Internal Medicine

## 2020-05-16 ENCOUNTER — Ambulatory Visit: Payer: Medicare Other | Admitting: Podiatry

## 2020-06-08 ENCOUNTER — Other Ambulatory Visit: Payer: Self-pay | Admitting: Internal Medicine

## 2020-06-23 ENCOUNTER — Other Ambulatory Visit: Payer: Self-pay | Admitting: Internal Medicine

## 2020-07-07 ENCOUNTER — Other Ambulatory Visit: Payer: Self-pay | Admitting: Internal Medicine

## 2020-08-05 ENCOUNTER — Other Ambulatory Visit: Payer: Self-pay

## 2020-08-05 ENCOUNTER — Other Ambulatory Visit: Payer: Self-pay | Admitting: Internal Medicine

## 2020-08-05 ENCOUNTER — Encounter: Payer: Self-pay | Admitting: Internal Medicine

## 2020-08-05 ENCOUNTER — Ambulatory Visit (INDEPENDENT_AMBULATORY_CARE_PROVIDER_SITE_OTHER): Payer: Medicare Other | Admitting: Podiatry

## 2020-08-05 DIAGNOSIS — B351 Tinea unguium: Secondary | ICD-10-CM | POA: Diagnosis not present

## 2020-08-05 DIAGNOSIS — M79674 Pain in right toe(s): Secondary | ICD-10-CM

## 2020-08-05 DIAGNOSIS — M79675 Pain in left toe(s): Secondary | ICD-10-CM | POA: Diagnosis not present

## 2020-08-06 NOTE — Progress Notes (Signed)
Subjective: 86 y.o. returns the office today for painful, elongated, thickened toenails he they cannot trim himself. Denies any redness or drainage around the nails. Denies any acute changes since last appointment and no new complaints today. Denies any systemic complaints such as fevers, chills, nausea, vomiting.   PCP: Binnie Rail, MD Last Seen: 02-06-2020  A1c: 6.0 on 10/02/2020  Objective: AAO 3, NAD DP/PT pulses palpable, CRT less than 3 seconds Nails hypertrophic, dystrophic, elongated, brittle, discolored 10. There is tenderness overlying the nails 1-5 bilaterally. There is no surrounding erythema or drainage along the nail sites. No open lesions or pre-ulcerative lesions are identified. No other areas of tenderness bilateral lower extremities. No overlying edema, erythema, increased warmth. No pain with calf compression, swelling, warmth, erythema.  Assessment: Patient presents with symptomatic onychomycosis  Plan: -Treatment options including alternatives, risks, complications were discussed -Nails sharply debrided 10 without complication/bleeding. -Discussed daily foot inspection. If there are any changes, to call the office immediately.  -Follow-up in 3 months or sooner if any problems are to arise. In the meantime, encouraged to call the office with any questions, concerns, changes symptoms.  Celesta Gentile, DPM

## 2020-08-11 DIAGNOSIS — N401 Enlarged prostate with lower urinary tract symptoms: Secondary | ICD-10-CM | POA: Insufficient documentation

## 2020-08-11 DIAGNOSIS — D649 Anemia, unspecified: Secondary | ICD-10-CM | POA: Insufficient documentation

## 2020-08-11 NOTE — Assessment & Plan Note (Addendum)
Chronic Mild Likely related to CKD CBC

## 2020-08-11 NOTE — Progress Notes (Signed)
Subjective:    Patient ID: Steven Matthews, male    DOB: 05/14/1931, 86 y.o.   MRN: [id number]  HPI The patient is here for follow up of their chronic medical problems, including htn, hypothyroidism, hyperlipidemia, GERD, prediabetes, anxiety, BPH  He is taking all of his medications as prescribed.    He and his niece has no concerns.       Medications and allergies reviewed with patient and updated if appropriate.  Patient Active Problem List   Diagnosis Date Noted  . BPH with obstruction/lower urinary tract symptoms 08/11/2020  . Anemia 08/11/2020  . Cellulitis 02/06/2020  . Elevated d-dimer 02/06/2020  . Prediabetes 08/19/2017  . GERD (gastroesophageal reflux disease) 02/15/2017  . Essential hypertension, benign 08/03/2016  . Hyperlipidemia 08/03/2016  . Hypothyroidism 08/03/2016  . History of prostate cancer 08/03/2016  . Intellectual disability 08/03/2016  . Anxiety 08/03/2016  . Nail hypertrophy 08/03/2016  . Chronic respiratory failure with hypoxia ([place]) 04/23/2016  . OSA (obstructive sleep apnea) 04/23/2016  . Upper airway cough syndrome 03/25/2016    Current Outpatient Medications on File Prior to Visit  Medication Sig Dispense Refill  . ALPRAZolam (XANAX) 0.25 MG tablet TAKE 1 TABLET(0.25 MG) BY MOUTH EVERY 6 HOURS AS NEEDED 60 tablet 0  . amLODipine (NORVASC) 10 MG tablet TAKE 1 TABLET(10 MG) BY MOUTH DAILY 90 tablet 0  . levothyroxine (SYNTHROID) 75 MCG tablet TAKE 1 TABLET BY MOUTH DAILY Annual appt due in Nov must see provider for future refills 90 tablet 1  . pantoprazole (PROTONIX) 40 MG tablet TAKE 1 TABLET BY MOUTH TWICE DAILY 30 MINUTES BEFORE BREAKFAST AND DINNER 180 tablet 0  . pravastatin (PRAVACHOL) 80 MG tablet TAKE 1 TABLET(80 MG) BY MOUTH AT BEDTIME 90 tablet 0  . tamsulosin (FLOMAX) 0.4 MG CAPS capsule TAKE 1 CAPSULE(0.4 MG) BY MOUTH DAILY AFTER SUPPER 90 capsule 2   No current facility-administered medications on file prior to visit.    Past  Medical History:  Diagnosis Date  . Hyperlipidemia   . Hypertension   . Prostate cancer [facility])     Past Surgical History:  Procedure Laterality Date  . None      Social History   Socioeconomic History  . Marital status: Single    Spouse name: Not on file  . Number of children: 0  . Years of education: Not on file  . Highest education level: Not on file  Occupational History  . Occupation: disabled  Tobacco Use  . Smoking status: Never Smoker  . Smokeless tobacco: Former Systems developer    Types: Snuff  Substance and Sexual Activity  . Alcohol use: No    Alcohol/week: 0.0 standard drinks  . Drug use: No  . Sexual activity: Not on file  Other Topics Concern  . Not on file  Social History Narrative  . Not on file   Social Determinants of Health   Financial Resource Strain:   . Difficulty of Paying Living Expenses: Not on file  Food Insecurity:   . Worried About Charity fundraiser in the Last Year: Not on file  . Ran Out of Food in the Last Year: Not on file  Transportation Needs:   . Lack of Transportation (Medical): Not on file  . Lack of Transportation (Non-Medical): Not on file  Physical Activity:   . Days of Exercise per Week: Not on file  . Minutes of Exercise per Session: Not on file  Stress:   . Feeling of Stress :  Not on file  Social Connections:   . Frequency of Communication with Friends and Family: Not on file  . Frequency of Social Gatherings with Friends and Family: Not on file  . Attends Religious Services: Not on file  . Active Member of Clubs or Organizations: Not on file  . Attends Archivist Meetings: Not on file  . Marital Status: Not on file    Family History  Problem Relation Age of Onset  . Hypertension Mother   . Stroke Mother   . Colon cancer Neg Hx   . Esophageal cancer Neg Hx   . Stomach cancer Neg Hx   . Rectal cancer Neg Hx   . Liver cancer Neg Hx     Review of Systems  Constitutional: Negative for appetite change, chills  and fever.  Respiratory: Positive for cough and shortness of breath (mild with exertion). Negative for wheezing.   Cardiovascular: Negative for chest pain, palpitations and leg swelling.  Gastrointestinal: Negative for abdominal pain.  Neurological: Negative for dizziness and headaches.  Psychiatric/Behavioral: Negative for dysphoric mood and sleep disturbance. The patient is nervous/anxious.        Objective:   Vitals:   08/12/20 1349  BP: 128/70  Pulse: 82  Temp: 98.7 F (37.1 C)  SpO2: 93%   BP Readings from Last 3 Encounters:  08/12/20 128/70  02/06/20 (!) 142/70  02/01/20 126/60   Wt Readings from Last 3 Encounters:  08/12/20 158 lb 6.4 oz (71.8 kg)  02/06/20 154 lb (69.9 kg)  02/01/20 157 lb 12.8 oz (71.6 kg)   Body mass index is 26.36 kg/m.   Physical Exam    Constitutional: Appears well-developed and well-nourished. No distress.  HENT:  Head: Normocephalic and atraumatic.  Neck: Neck supple. No tracheal deviation present. No thyromegaly present.  No cervical lymphadenopathy Cardiovascular: Normal rate, regular rhythm and normal heart sounds.   No murmur heard. No carotid bruit .  No edema Pulmonary/Chest: Effort normal and breath sounds normal. No respiratory distress. No has no wheezes. No rales.  Skin: Skin is warm and dry. Not diaphoretic.  Psychiatric: Normal mood and affect. Behavior is normal.      Assessment & Plan:    See Problem List for Assessment and Plan of chronic medical problems.    This visit occurred during the SARS-CoV-2 public health emergency.  Safety protocols were in place, including screening questions prior to the visit, additional usage of staff PPE, and extensive cleaning of exam room while observing appropriate contact time as indicated for disinfecting solutions.

## 2020-08-11 NOTE — Patient Instructions (Addendum)
  Blood work was ordered.     Medications reviewed and updated.  Changes include :   none  Your prescription(s) have been submitted to your pharmacy. Please take as directed and contact our office if you believe you are having problem(s) with the medication(s).   Please followup in 6 months   

## 2020-08-12 ENCOUNTER — Ambulatory Visit (INDEPENDENT_AMBULATORY_CARE_PROVIDER_SITE_OTHER): Payer: Medicare Other | Admitting: Internal Medicine

## 2020-08-12 ENCOUNTER — Encounter: Payer: Self-pay | Admitting: Internal Medicine

## 2020-08-12 ENCOUNTER — Other Ambulatory Visit: Payer: Self-pay

## 2020-08-12 VITALS — BP 128/70 | HR 82 | Temp 98.7°F | Wt 158.4 lb

## 2020-08-12 DIAGNOSIS — F419 Anxiety disorder, unspecified: Secondary | ICD-10-CM | POA: Diagnosis not present

## 2020-08-12 DIAGNOSIS — R7303 Prediabetes: Secondary | ICD-10-CM

## 2020-08-12 DIAGNOSIS — N138 Other obstructive and reflux uropathy: Secondary | ICD-10-CM

## 2020-08-12 DIAGNOSIS — E7849 Other hyperlipidemia: Secondary | ICD-10-CM

## 2020-08-12 DIAGNOSIS — E039 Hypothyroidism, unspecified: Secondary | ICD-10-CM

## 2020-08-12 DIAGNOSIS — D649 Anemia, unspecified: Secondary | ICD-10-CM | POA: Diagnosis not present

## 2020-08-12 DIAGNOSIS — K219 Gastro-esophageal reflux disease without esophagitis: Secondary | ICD-10-CM | POA: Diagnosis not present

## 2020-08-12 DIAGNOSIS — N401 Enlarged prostate with lower urinary tract symptoms: Secondary | ICD-10-CM | POA: Diagnosis not present

## 2020-08-12 DIAGNOSIS — I10 Essential (primary) hypertension: Secondary | ICD-10-CM

## 2020-08-12 MED ORDER — ALPRAZOLAM 0.25 MG PO TABS: ORAL_TABLET | ORAL | 0 refills | Status: DC

## 2020-08-12 NOTE — Assessment & Plan Note (Signed)
Chronic Controlled, stable Continue current dose of medication - xanax

## 2020-08-12 NOTE — Assessment & Plan Note (Signed)
Chronic GERD controlled Continue daily medication  

## 2020-08-12 NOTE — Assessment & Plan Note (Signed)
Chronic Controlled, stable Continue current dose of flomax

## 2020-08-12 NOTE — Assessment & Plan Note (Signed)
Chronic Check a1c Low sugar / carb diet 

## 2020-08-12 NOTE — Assessment & Plan Note (Signed)
Chronic  Clinically euthyroid Check tsh  Titrate med dose if needed  

## 2020-08-12 NOTE — Assessment & Plan Note (Signed)
Chronic BP well controlled Current regimen effective and well tolerated Continue current medications at current doses cmp  

## 2020-08-12 NOTE — Assessment & Plan Note (Signed)
Chronic Check lipid panel  Continue daily statin Regular exercise and healthy diet encouraged  

## 2020-08-13 LAB — COMPLETE METABOLIC PANEL WITH GFR
AG Ratio: 1.1 (calc) (ref 1.0–2.5)
ALT: 11 U/L (ref 9–46)
AST: 22 U/L (ref 10–35)
Albumin: 4.5 g/dL (ref 3.6–5.1)
Alkaline phosphatase (APISO): 73 U/L (ref 35–144)
BUN/Creatinine Ratio: 12 (calc) (ref 6–22)
BUN: 22 mg/dL (ref 7–25)
CO2: 26 mmol/L (ref 20–32)
Calcium: 10.1 mg/dL (ref 8.6–10.3)
Chloride: 106 mmol/L (ref 98–110)
Creat: 1.88 mg/dL — ABNORMAL HIGH (ref 0.70–1.11)
GFR, Est African American: 36 mL/min/{1.73_m2} — ABNORMAL LOW (ref >=60)
GFR, Est Non African American: 31 mL/min/{1.73_m2} — ABNORMAL LOW (ref >=60)
Globulin: 4.1 g/dL (calc) — ABNORMAL HIGH (ref 1.9–3.7)
Glucose, Bld: 104 mg/dL — ABNORMAL HIGH (ref 65–99)
Potassium: 5 mmol/L (ref 3.5–5.3)
Sodium: 144 mmol/L (ref 135–146)
Total Bilirubin: 1 mg/dL (ref 0.2–1.2)
Total Protein: 8.6 g/dL — ABNORMAL HIGH (ref 6.1–8.1)

## 2020-08-13 LAB — HEMOGLOBIN A1C
Hgb A1c MFr Bld: 6.1 % of total Hgb — ABNORMAL HIGH (ref <5.7)
Mean Plasma Glucose: 128 (calc)
eAG (mmol/L): 7.1 (calc)

## 2020-08-13 LAB — LIPID PANEL
Cholesterol: 175 mg/dL (ref <200)
HDL: 61 mg/dL (ref >=40)
LDL Cholesterol (Calc): 93 mg/dL (calc)
Non-HDL Cholesterol (Calc): 114 mg/dL (calc) (ref <130)
Total CHOL/HDL Ratio: 2.9 (calc) (ref <5.0)
Triglycerides: 116 mg/dL (ref <150)

## 2020-08-13 LAB — CBC WITH DIFFERENTIAL/PLATELET
Absolute Monocytes: 547 cells/uL (ref 200–950)
Basophils Absolute: 50 cells/uL (ref 0–200)
Basophils Relative: 0.7 %
Eosinophils Absolute: 101 cells/uL (ref 15–500)
Eosinophils Relative: 1.4 %
HCT: 42.5 % (ref 38.5–50.0)
Hemoglobin: 13.9 g/dL (ref 13.2–17.1)
Lymphs Abs: 1526 cells/uL (ref 850–3900)
MCH: 28.8 pg (ref 27.0–33.0)
MCHC: 32.7 g/dL (ref 32.0–36.0)
MCV: 88.2 fL (ref 80.0–100.0)
MPV: 11.1 fL (ref 7.5–12.5)
Monocytes Relative: 7.6 %
Neutro Abs: 4975 cells/uL (ref 1500–7800)
Neutrophils Relative %: 69.1 %
Platelets: 188 10*3/uL (ref 140–400)
RBC: 4.82 10*6/uL (ref 4.20–5.80)
RDW: 13.8 % (ref 11.0–15.0)
Total Lymphocyte: 21.2 %
WBC: 7.2 10*3/uL (ref 3.8–10.8)

## 2020-08-13 LAB — TSH: TSH: 3.57 mIU/L (ref 0.40–4.50)

## 2020-09-21 ENCOUNTER — Other Ambulatory Visit: Payer: Self-pay | Admitting: Internal Medicine

## 2020-10-02 ENCOUNTER — Other Ambulatory Visit: Payer: Self-pay | Admitting: Internal Medicine

## 2020-10-29 ENCOUNTER — Other Ambulatory Visit: Payer: Self-pay | Admitting: Internal Medicine

## 2020-11-04 ENCOUNTER — Ambulatory Visit (INDEPENDENT_AMBULATORY_CARE_PROVIDER_SITE_OTHER): Payer: Medicare Other | Admitting: Podiatry

## 2020-11-04 ENCOUNTER — Other Ambulatory Visit: Payer: Self-pay

## 2020-11-04 DIAGNOSIS — M79675 Pain in left toe(s): Secondary | ICD-10-CM | POA: Diagnosis not present

## 2020-11-04 DIAGNOSIS — M79674 Pain in right toe(s): Secondary | ICD-10-CM | POA: Diagnosis not present

## 2020-11-04 DIAGNOSIS — B351 Tinea unguium: Secondary | ICD-10-CM

## 2020-11-04 NOTE — Progress Notes (Signed)
Subjective: 86 y.o. returns the office today for painful, elongated, thickened toenails he they cannot trim himself. Denies any redness or drainage around the nails. Denies any acute changes since last appointment and no new complaints today. Denies any systemic complaints such as fevers, chills, nausea, vomiting.   PCP: Binnie Rail, MD Last Seen: 08/12/2020 A1c: 6.1 on 08/12/2020  Objective: NAD- niece present  DP/PT pulses palpable, CRT less than 3 seconds Nails hypertrophic, dystrophic, elongated, brittle, discolored 10. There is tenderness overlying the nails 1-5 bilaterally. There is no surrounding erythema or drainage along the nail sites. No open lesions or pre-ulcerative lesions are identified. No other areas of tenderness bilateral lower extremities. No overlying edema, erythema, increased warmth. No pain with calf compression, swelling, warmth, erythema.  Assessment: Patient presents with symptomatic onychomycosis  Plan: -Treatment options including alternatives, risks, complications were discussed -Nails sharply debrided 10 without complication/bleeding. -Discussed daily foot inspection. If there are any changes, to call the office immediately.  -Follow-up in 3 months or sooner if any problems are to arise. In the meantime, encouraged to call the office with any questions, concerns, changes symptoms.  Celesta Gentile, DPM

## 2020-12-05 ENCOUNTER — Other Ambulatory Visit: Payer: Self-pay | Admitting: Internal Medicine

## 2020-12-20 ENCOUNTER — Other Ambulatory Visit: Payer: Self-pay | Admitting: Internal Medicine

## 2021-02-03 ENCOUNTER — Other Ambulatory Visit: Payer: Self-pay

## 2021-02-03 ENCOUNTER — Ambulatory Visit (INDEPENDENT_AMBULATORY_CARE_PROVIDER_SITE_OTHER): Payer: Medicare Other | Admitting: Podiatry

## 2021-02-03 DIAGNOSIS — M79675 Pain in left toe(s): Secondary | ICD-10-CM | POA: Diagnosis not present

## 2021-02-03 DIAGNOSIS — B351 Tinea unguium: Secondary | ICD-10-CM | POA: Diagnosis not present

## 2021-02-03 DIAGNOSIS — M79674 Pain in right toe(s): Secondary | ICD-10-CM

## 2021-02-03 NOTE — Progress Notes (Signed)
Subjective: 87 y.o. returns the office today for painful, elongated, thickened toenails he they cannot trim himself. No open lesion. Denies any systemic complaints such as fevers, chills, nausea, vomiting.   PCP: Binnie Rail, MD  A1c: 6.1 on 08/12/2020  Objective: NAD- niece present  DP/PT pulses palpable, CRT less than 3 seconds Nails hypertrophic, dystrophic, elongated, brittle, discolored 10. There is tenderness overlying the nails 1-5 bilaterally. There is no surrounding erythema or drainage along the nail sites. No open lesions or pre-ulcerative lesions are identified. No other areas of tenderness bilateral lower extremities. No overlying edema, erythema, increased warmth. No pain with calf compression, swelling, warmth, erythema.  Assessment: Patient presents with symptomatic onychomycosis  Plan: -Treatment options including alternatives, risks, complications were discussed -Nails sharply debrided 10 without complication/bleeding. -Discussed daily foot inspection. If there are any changes, to call the office immediately.  -Follow-up in 3 months or sooner if any problems are to arise. In the meantime, encouraged to call the office with any questions, concerns, changes symptoms.  Celesta Gentile, DPM

## 2021-02-09 NOTE — Progress Notes (Signed)
Subjective:    Patient ID: Steven Matthews, male    DOB: 01-25-1931, 87 y.o.   MRN: [id number]  HPI The patient is here for follow up of their chronic medical problems, including htn, hyperlipidemia, hypothyroidism, GERD, prediabetes, anxiety, BPH  His niece is here and provides the history.   He is taking all of his medications as prescribed.    He still has the dry cough that is daily and intermittent.  He has seen GI and pulm and ? ENT in the past.  No cause was found.   Medications and allergies reviewed with patient and updated if appropriate.  Patient Active Problem List   Diagnosis Date Noted  . BPH with obstruction/lower urinary tract symptoms 08/11/2020  . Anemia 08/11/2020  . Cellulitis 02/06/2020  . Elevated d-dimer 02/06/2020  . Prediabetes 08/19/2017  . GERD (gastroesophageal reflux disease) 02/15/2017  . Essential hypertension, benign 08/03/2016  . Hyperlipidemia 08/03/2016  . Hypothyroidism 08/03/2016  . History of prostate cancer 08/03/2016  . Intellectual disability 08/03/2016  . Anxiety 08/03/2016  . Nail hypertrophy 08/03/2016  . Chronic respiratory failure with hypoxia ([place]) 04/23/2016  . OSA (obstructive sleep apnea) 04/23/2016  . Upper airway cough syndrome 03/25/2016    Current Outpatient Medications on File Prior to Visit  Medication Sig Dispense Refill  . ALPRAZolam (XANAX) 0.25 MG tablet TAKE 1 TABLET(0.25 MG) BY MOUTH EVERY 6 HOURS AS NEEDED 60 tablet 0  . amLODipine (NORVASC) 10 MG tablet TAKE 1 TABLET(10 MG) BY MOUTH DAILY 90 tablet 0  . levothyroxine (SYNTHROID) 75 MCG tablet TAKE 1 TABLET BY MOUTH DAILY 90 tablet 1  . pantoprazole (PROTONIX) 40 MG tablet TAKE 1 TABLET BY MOUTH TWICE DAILY 30 MINUTES BEFORE BREAKFAST AND DINNER 180 tablet 0  . pravastatin (PRAVACHOL) 80 MG tablet TAKE 1 TABLET(80 MG) BY MOUTH AT BEDTIME 90 tablet 0  . tamsulosin (FLOMAX) 0.4 MG CAPS capsule TAKE 1 CAPSULE(0.4 MG) BY MOUTH DAILY AFTER SUPPER 90 capsule 2   No  current facility-administered medications on file prior to visit.    Past Medical History:  Diagnosis Date  . Hyperlipidemia   . Hypertension   . Prostate cancer [facility])     Past Surgical History:  Procedure Laterality Date  . None      Social History   Socioeconomic History  . Marital status: Single    Spouse name: Not on file  . Number of children: 0  . Years of education: Not on file  . Highest education level: Not on file  Occupational History  . Occupation: disabled  Tobacco Use  . Smoking status: Never Smoker  . Smokeless tobacco: Former Systems developer    Types: Snuff  Substance and Sexual Activity  . Alcohol use: No    Alcohol/week: 0.0 standard drinks  . Drug use: No  . Sexual activity: Not on file  Other Topics Concern  . Not on file  Social History Narrative  . Not on file   Social Determinants of Health   Financial Resource Strain: Low Risk   . Difficulty of Paying Living Expenses: Not hard at all  Food Insecurity: No Food Insecurity  . Worried About Charity fundraiser in the Last Year: Never true  . Ran Out of Food in the Last Year: Never true  Transportation Needs: No Transportation Needs  . Lack of Transportation (Medical): No  . Lack of Transportation (Non-Medical): No  Physical Activity: Inactive  . Days of Exercise per Week: 0 days  .  Minutes of Exercise per Session: 0 min  Stress: No Stress Concern Present  . Feeling of Stress : Not at all  Social Connections: Not on file    Family History  Problem Relation Age of Onset  . Hypertension Mother   . Stroke Mother   . Colon cancer Neg Hx   . Esophageal cancer Neg Hx   . Stomach cancer Neg Hx   . Rectal cancer Neg Hx   . Liver cancer Neg Hx     Review of Systems  Constitutional: Negative for appetite change, chills and fever.  Respiratory: Positive for cough (dry, intermittent). Negative for shortness of breath and wheezing.   Cardiovascular: Negative for chest pain, palpitations and leg  swelling.  Gastrointestinal: Negative for abdominal pain, constipation and diarrhea.  Genitourinary: Negative for difficulty urinating.  Neurological: Negative for light-headedness and headaches.       Objective:   Vitals:   02/10/21 1438  BP: 130/78  Pulse: 78  Temp: 98.3 F (36.8 C)  SpO2: 92%   BP Readings from Last 3 Encounters:  02/10/21 130/78  02/10/21 130/78  08/12/20 128/70   Wt Readings from Last 3 Encounters:  02/10/21 163 lb 3.2 oz (74 kg)  02/10/21 163 lb 3.2 oz (74 kg)  08/12/20 158 lb 6.4 oz (71.8 kg)   Body mass index is 27.16 kg/m.   Physical Exam    Constitutional: Appears well-developed and well-nourished. No distress.  HENT:  Head: Normocephalic and atraumatic.  Neck: Neck supple. No tracheal deviation present. No thyromegaly present.  No cervical lymphadenopathy Cardiovascular: Normal rate, regular rhythm and normal heart sounds.   No murmur heard. No carotid bruit .  No edema Pulmonary/Chest: Effort normal and breath sounds normal. No respiratory distress. No has no wheezes. No rales.  Skin: Skin is warm and dry. Not diaphoretic.  Psychiatric: Normal mood and affect. Behavior is normal.      Assessment & Plan:    See Problem List for Assessment and Plan of chronic medical problems.    This visit occurred during the SARS-CoV-2 public health emergency.  Safety protocols were in place, including screening questions prior to the visit, additional usage of staff PPE, and extensive cleaning of exam room while observing appropriate contact time as indicated for disinfecting solutions.

## 2021-02-09 NOTE — Patient Instructions (Addendum)
    Blood work was ordered.     Medications changes include :  none   Your prescription(s) have been submitted to your pharmacy. Please take as directed and contact our office if you believe you are having problem(s) with the medication(s).   Please followup in 6 months  

## 2021-02-10 ENCOUNTER — Encounter: Payer: Self-pay | Admitting: Internal Medicine

## 2021-02-10 ENCOUNTER — Ambulatory Visit (INDEPENDENT_AMBULATORY_CARE_PROVIDER_SITE_OTHER): Payer: Medicare Other

## 2021-02-10 ENCOUNTER — Ambulatory Visit (INDEPENDENT_AMBULATORY_CARE_PROVIDER_SITE_OTHER): Payer: Medicare Other | Admitting: Internal Medicine

## 2021-02-10 ENCOUNTER — Other Ambulatory Visit: Payer: Self-pay

## 2021-02-10 VITALS — BP 130/78 | HR 78 | Temp 98.3°F | Ht 65.0 in | Wt 163.2 lb

## 2021-02-10 DIAGNOSIS — R7303 Prediabetes: Secondary | ICD-10-CM | POA: Diagnosis not present

## 2021-02-10 DIAGNOSIS — Z23 Encounter for immunization: Secondary | ICD-10-CM | POA: Diagnosis not present

## 2021-02-10 DIAGNOSIS — K219 Gastro-esophageal reflux disease without esophagitis: Secondary | ICD-10-CM

## 2021-02-10 DIAGNOSIS — N138 Other obstructive and reflux uropathy: Secondary | ICD-10-CM

## 2021-02-10 DIAGNOSIS — I10 Essential (primary) hypertension: Secondary | ICD-10-CM | POA: Diagnosis not present

## 2021-02-10 DIAGNOSIS — Z0000 Encounter for general adult medical examination without abnormal findings: Secondary | ICD-10-CM | POA: Diagnosis not present

## 2021-02-10 DIAGNOSIS — E7849 Other hyperlipidemia: Secondary | ICD-10-CM

## 2021-02-10 DIAGNOSIS — E039 Hypothyroidism, unspecified: Secondary | ICD-10-CM | POA: Diagnosis not present

## 2021-02-10 DIAGNOSIS — N401 Enlarged prostate with lower urinary tract symptoms: Secondary | ICD-10-CM

## 2021-02-10 DIAGNOSIS — F419 Anxiety disorder, unspecified: Secondary | ICD-10-CM

## 2021-02-10 LAB — COMPREHENSIVE METABOLIC PANEL
ALT: 12 U/L (ref 0–53)
AST: 17 U/L (ref 0–37)
Albumin: 4.4 g/dL (ref 3.5–5.2)
Alkaline Phosphatase: 66 U/L (ref 39–117)
BUN: 16 mg/dL (ref 6–23)
CO2: 28 mEq/L (ref 19–32)
Calcium: 9.7 mg/dL (ref 8.4–10.5)
Chloride: 103 mEq/L (ref 96–112)
Creatinine, Ser: 1.52 mg/dL — ABNORMAL HIGH (ref 0.40–1.50)
GFR: 40.8 mL/min — ABNORMAL LOW (ref >60.00)
Glucose, Bld: 93 mg/dL (ref 70–99)
Potassium: 4.4 mEq/L (ref 3.5–5.1)
Sodium: 140 mEq/L (ref 135–145)
Total Bilirubin: 0.9 mg/dL (ref 0.2–1.2)
Total Protein: 8.2 g/dL (ref 6.0–8.3)

## 2021-02-10 LAB — LIPID PANEL
Cholesterol: 151 mg/dL (ref 0–200)
HDL: 53.3 mg/dL (ref >39.00)
LDL Cholesterol: 77 mg/dL (ref 0–99)
NonHDL: 97.48
Total CHOL/HDL Ratio: 3
Triglycerides: 103 mg/dL (ref 0.0–149.0)
VLDL: 20.6 mg/dL (ref 0.0–40.0)

## 2021-02-10 LAB — TSH: TSH: 3.19 u[IU]/mL (ref 0.35–4.50)

## 2021-02-10 LAB — HEMOGLOBIN A1C: Hgb A1c MFr Bld: 6.3 % (ref 4.6–6.5)

## 2021-02-10 NOTE — Assessment & Plan Note (Signed)
Chronic Denies difficulty urinating Continue flomax 0.4 mg daily

## 2021-02-10 NOTE — Assessment & Plan Note (Signed)
Chronic Check a1c Low sugar / carb diet 

## 2021-02-10 NOTE — Assessment & Plan Note (Signed)
Chronic GERD controlled Continue  Pantoprazole 40 mg twice daily

## 2021-02-10 NOTE — Patient Instructions (Addendum)
Steven Matthews , Thank you for taking time to come for your Medicare Wellness Visit. I appreciate your ongoing commitment to your health goals. Please review the following plan we discussed and let me know if I can assist you in the future.   Screening recommendations/referrals: Colonoscopy: no record Recommended yearly ophthalmology/optometry visit for glaucoma screening and checkup Recommended yearly dental visit for hygiene and checkup  Vaccinations: Influenza vaccine: 02/10/2021 Pneumococcal vaccine: 06/09/2006; need update Tdap vaccine: declined Shingles vaccine: declined   Covid-19: 04/05/2020 [name])  Advanced directives: Advance directive discussed with you today. Even though you declined this today please call our office should you change your mind and we can give you the proper paperwork for you to fill out.  Conditions/risks identified: Yes; identified conditions and risks.  Next appointment: Please schedule your next Medicare Wellness Visit with your Nurse Health Advisor in 1 year by calling [phone].  Preventive Care 85 Years and Older, Male Preventive care refers to lifestyle choices and visits with your health care provider that can promote health and wellness. What does preventive care include?  A yearly physical exam. This is also called an annual well check.  Dental exams once or twice a year.  Routine eye exams. Ask your health care provider how often you should have your eyes checked.  Personal lifestyle choices, including:  Daily care of your teeth and gums.  Regular physical activity.  Eating a healthy diet.  Avoiding tobacco and drug use.  Limiting alcohol use.  Practicing safe sex.  Taking low doses of aspirin every day.  Taking vitamin and mineral supplements as recommended by your health care provider. What happens during an annual well check? The services and screenings done by your health care provider during your annual well check will  depend on your age, overall health, lifestyle risk factors, and family history of disease. Counseling  Your health care provider may ask you questions about your:  Alcohol use.  Tobacco use.  Drug use.  Emotional well-being.  Home and relationship well-being.  Sexual activity.  Eating habits.  History of falls.  Memory and ability to understand (cognition).  Work and work Statistician. Screening  You may have the following tests or measurements:  Height, weight, and BMI.  Blood pressure.  Lipid and cholesterol levels. These may be checked every 5 years, or more frequently if you are over 85 years old.  Skin check.  Lung cancer screening. You may have this screening every year starting at age 44 if you have a 30-pack-year history of smoking and currently smoke or have quit within the past 15 years.  Fecal occult blood test (FOBT) of the stool. You may have this test every year starting at age 40.  Flexible sigmoidoscopy or colonoscopy. You may have a sigmoidoscopy every 5 years or a colonoscopy every 10 years starting at age 85.  Prostate cancer screening. Recommendations will vary depending on your family history and other risks.  Hepatitis C blood test.  Hepatitis B blood test.  Sexually transmitted disease (STD) testing.  Diabetes screening. This is done by checking your blood sugar (glucose) after you have not eaten for a while (fasting). You may have this done every 1-3 years.  Abdominal aortic aneurysm (AAA) screening. You may need this if you are a current or former smoker.  Osteoporosis. You may be screened starting at age 85 if you are at high risk. Talk with your health care provider about your test results, treatment options, and if necessary, the  need for more tests. Vaccines  Your health care provider may recommend certain vaccines, such as:  Influenza vaccine. This is recommended every year.  Tetanus, diphtheria, and acellular pertussis (Tdap,  Td) vaccine. You may need a Td booster every 10 years.  Zoster vaccine. You may need this after age 85.  Pneumococcal 13-valent conjugate (PCV13) vaccine. One dose is recommended after age 85.  Pneumococcal polysaccharide (PPSV23) vaccine. One dose is recommended after age 85. Talk to your health care provider about which screenings and vaccines you need and how often you need them. This information is not intended to replace advice given to you by your health care provider. Make sure you discuss any questions you have with your health care provider. Document Released: 12/06/2015 Document Revised: 07/29/2016 Document Reviewed: 09/10/2015 Elsevier Interactive Patient Education  2017 [employer] Prevention in the Home Falls can cause injuries. They can happen to people of all ages. There are many things you can do to make your home safe and to help prevent falls. What can I do on the outside of my home?  Regularly fix the edges of walkways and driveways and fix any cracks.  Remove anything that might make you trip as you walk through a door, such as a raised step or threshold.  Trim any bushes or trees on the path to your home.  Use bright outdoor lighting.  Clear any walking paths of anything that might make someone trip, such as rocks or tools.  Regularly check to see if handrails are loose or broken. Make sure that both sides of any steps have handrails.  Any raised decks and porches should have guardrails on the edges.  Have any leaves, snow, or ice cleared regularly.  Use sand or salt on walking paths during winter.  Clean up any spills in your garage right away. This includes oil or grease spills. What can I do in the bathroom?  Use night lights.  Install grab bars by the toilet and in the tub and shower. Do not use towel bars as grab bars.  Use non-skid mats or decals in the tub or shower.  If you need to sit down in the shower, use a plastic, non-slip  stool.  Keep the floor dry. Clean up any water that spills on the floor as soon as it happens.  Remove soap buildup in the tub or shower regularly.  Attach bath mats securely with double-sided non-slip rug tape.  Do not have throw rugs and other things on the floor that can make you trip. What can I do in the bedroom?  Use night lights.  Make sure that you have a light by your bed that is easy to reach.  Do not use any sheets or blankets that are too big for your bed. They should not hang down onto the floor.  Have a firm chair that has side arms. You can use this for support while you get dressed.  Do not have throw rugs and other things on the floor that can make you trip. What can I do in the kitchen?  Clean up any spills right away.  Avoid walking on wet floors.  Keep items that you use a lot in easy-to-reach places.  If you need to reach something above you, use a strong step stool that has a grab bar.  Keep electrical cords out of the way.  Do not use floor polish or wax that makes floors slippery. If you must use wax,  use non-skid floor wax.  Do not have throw rugs and other things on the floor that can make you trip. What can I do with my stairs?  Do not leave any items on the stairs.  Make sure that there are handrails on both sides of the stairs and use them. Fix handrails that are broken or loose. Make sure that handrails are as long as the stairways.  Check any carpeting to make sure that it is firmly attached to the stairs. Fix any carpet that is loose or worn.  Avoid having throw rugs at the top or bottom of the stairs. If you do have throw rugs, attach them to the floor with carpet tape.  Make sure that you have a light switch at the top of the stairs and the bottom of the stairs. If you do not have them, ask someone to add them for you. What else can I do to help prevent falls?  Wear shoes that:  Do not have high heels.  Have rubber bottoms.  Are  comfortable and fit you well.  Are closed at the toe. Do not wear sandals.  If you use a stepladder:  Make sure that it is fully opened. Do not climb a closed stepladder.  Make sure that both sides of the stepladder are locked into place.  Ask someone to hold it for you, if possible.  Clearly mark and make sure that you can see:  Any grab bars or handrails.  First and last steps.  Where the edge of each step is.  Use tools that help you move around (mobility aids) if they are needed. These include:  Canes.  Walkers.  Scooters.  Crutches.  Turn on the lights when you go into a dark area. Replace any light bulbs as soon as they burn out.  Set up your furniture so you have a clear path. Avoid moving your furniture around.  If any of your floors are uneven, fix them.  If there are any pets around you, be aware of where they are.  Review your medicines with your doctor. Some medicines can make you feel dizzy. This can increase your chance of falling. Ask your doctor what other things that you can do to help prevent falls. This information is not intended to replace advice given to you by your health care provider. Make sure you discuss any questions you have with your health care provider. Document Released: 09/05/2009 Document Revised: 04/16/2016 Document Reviewed: 12/14/2014 Elsevier Interactive Patient Education  2017 [employer].

## 2021-02-10 NOTE — Assessment & Plan Note (Signed)
Chronic  Clinically euthyroid Currently taking levothyroxine 75 mcg daily Check tsh  Titrate med dose if needed  

## 2021-02-10 NOTE — Progress Notes (Signed)
Subjective:   Steven Matthews is a 87 y.o. male who presents for Medicare Annual/Subsequent preventive examination.  Review of Systems    No ROS. Medicare Wellness Visit. Additional risk factors are reflected in social history. Cardiac Risk Factors include: advanced age (>51men, >61 women);dyslipidemia;hypertension;family history of premature cardiovascular disease;male gender     Objective:    Today's Vitals   02/10/21 1401  BP: 130/78  Pulse: 78  Temp: 98.3 F (36.8 C)  SpO2: 92%  Weight: 163 lb 3.2 oz (74 kg)  Height: 5\' 5"  (1.651 m)  PainSc: 0-No pain   Body mass index is 27.16 kg/m.  Advanced Directives 02/10/2021 08/16/2017 04/17/2016  Does Patient Have a Medical Advance Directive? Yes Yes No  Type of Paramedic of [place];Living will [place];Living will -  Does patient want to make changes to medical advance directive? No - Patient declined - -  Copy of [place] in Chart? No - copy requested No - copy requested -    Current Medications (verified) Outpatient Encounter Medications as of 02/10/2021  Medication Sig  . ALPRAZolam (XANAX) 0.25 MG tablet TAKE 1 TABLET(0.25 MG) BY MOUTH EVERY 6 HOURS AS NEEDED  . amLODipine (NORVASC) 10 MG tablet TAKE 1 TABLET(10 MG) BY MOUTH DAILY  . levothyroxine (SYNTHROID) 75 MCG tablet TAKE 1 TABLET BY MOUTH DAILY  . pantoprazole (PROTONIX) 40 MG tablet TAKE 1 TABLET BY MOUTH TWICE DAILY 30 MINUTES BEFORE BREAKFAST AND DINNER  . pravastatin (PRAVACHOL) 80 MG tablet TAKE 1 TABLET(80 MG) BY MOUTH AT BEDTIME  . tamsulosin (FLOMAX) 0.4 MG CAPS capsule TAKE 1 CAPSULE(0.4 MG) BY MOUTH DAILY AFTER SUPPER   No facility-administered encounter medications on file as of 02/10/2021.    Allergies (verified) Patient has no allergy information on record.   History: Past Medical History:  Diagnosis Date  . Hyperlipidemia   . Hypertension   . Prostate cancer [facility])    Past Surgical  History:  Procedure Laterality Date  . None     Family History  Problem Relation Age of Onset  . Hypertension Mother   . Stroke Mother   . Colon cancer Neg Hx   . Esophageal cancer Neg Hx   . Stomach cancer Neg Hx   . Rectal cancer Neg Hx   . Liver cancer Neg Hx    Social History   Socioeconomic History  . Marital status: Single    Spouse name: Not on file  . Number of children: 0  . Years of education: Not on file  . Highest education level: Not on file  Occupational History  . Occupation: disabled  Tobacco Use  . Smoking status: Never Smoker  . Smokeless tobacco: Former Systems developer    Types: Snuff  Substance and Sexual Activity  . Alcohol use: No    Alcohol/week: 0.0 standard drinks  . Drug use: No  . Sexual activity: Not on file  Other Topics Concern  . Not on file  Social History Narrative  . Not on file   Social Determinants of Health   Financial Resource Strain: Low Risk   . Difficulty of Paying Living Expenses: Not hard at all  Food Insecurity: No Food Insecurity  . Worried About Charity fundraiser in the Last Year: Never true  . Ran Out of Food in the Last Year: Never true  Transportation Needs: No Transportation Needs  . Lack of Transportation (Medical): No  . Lack of Transportation (Non-Medical): No  Physical Activity: Inactive  . Days of Exercise per Week: 0 days  . Minutes of Exercise per Session: 0 min  Stress: No Stress Concern Present  . Feeling of Stress : Not at all  Social Connections: Not on file    Tobacco Counseling Counseling given: Not Answered   Clinical Intake:  Pre-visit preparation completed: Yes  Pain : No/denies pain Pain Score: 0-No pain     BMI - recorded: 27.16 Nutritional Status: BMI 25 -29 Overweight Nutritional Risks: None Diabetes: No  How often do you need to have someone help you when you read instructions, pamphlets, or other written materials from your doctor or pharmacy?: 1 - Never What is the last grade  level you completed in school?: [employer]  Diabetic? no  Interpreter Needed?: No  Information entered by :: Lisette Abu, LPN   Activities of Daily Living In your present state of health, do you have any difficulty performing the following activities: 02/10/2021  Hearing? N  Vision? N  Difficulty concentrating or making decisions? Y  Walking or climbing stairs? N  Dressing or bathing? N  Doing errands, shopping? Y  Preparing Food and eating ? N  Using the Toilet? N  In the past six months, have you accidently leaked urine? Y  Comment wears Depends at night  Do you have problems with loss of bowel control? Y  Comment wears Depends at night  Managing your Medications? Y  Managing your Finances? Y  Housekeeping or managing your Housekeeping? Y  Some recent data might be hidden    Patient Care Team: Binnie Rail, MD as PCP - General (Internal Medicine) Trula Slade, DPM as Consulting Physician (Podiatry) Tanda Rockers, MD as Consulting Physician (Pulmonary Disease) Mauri Pole, MD as Consulting Physician (Gastroenterology)  Indicate any recent Medical Services you may have received from other than [facility] providers in the past year (date may be approximate).     Assessment:   This is a routine wellness examination for Steven Matthews.  Hearing/Vision screen No exam data present  Dietary issues and exercise activities discussed: Current Exercise Habits: The patient does not participate in regular exercise at present, Exercise limited by: respiratory conditions(s);cardiac condition(s)  Goals    . Maintain current health status      Depression Screen PHQ 2/9 Scores 02/10/2021 02/06/2020 08/22/2018 08/16/2017 08/03/2016  PHQ - 2 Score 0 - 0 0 0  PHQ- 9 Score - - 0 - -  Exception Documentation - Other- indicate reason in comment box - - -  Not completed - Dementia - - -    Fall Risk Fall Risk  02/10/2021 02/06/2020 10/16/2019 08/22/2018 08/16/2017  Falls in the  past year? 0 0 0 No No  Comment - - Emmi Telephone Survey: data to providers prior to load - -  Number falls in past yr: 0 0 - - -  Injury with Fall? 0 0 - - -  Risk for fall due to : No Fall Risks - - - -  Follow up Falls evaluation completed - - - -    FALL RISK PREVENTION PERTAINING TO THE HOME:  Any stairs in or around the home? No  If so, are there any without handrails? No  Home free of loose throw rugs in walkways, pet beds, electrical cords, etc? Yes  Adequate lighting in your home to reduce risk of falls? Yes   ASSISTIVE DEVICES UTILIZED TO PREVENT FALLS:  Life alert? No  Use of a cane, walker  or w/c? No  Grab bars in the bathroom? No  Shower chair or bench in shower? No  Elevated toilet seat or a handicapped toilet? No   TIMED UP AND GO:  Was the test performed? No .  Length of time to ambulate 10 feet: 0 sec.   Gait steady and fast without use of assistive device  Cognitive Function: MMSE - Mini Mental State Exam 02/10/2021 08/16/2017  Not completed: Unable to complete Unable to complete        Immunizations Immunization History  Administered Date(s) Administered  . Influenza, High Dose Seasonal PF 08/03/2016, 08/16/2017, 08/22/2018  . Influenza,inj,Quad PF,6+ Mos 10/18/2015  . Janssen (J&J) SARS-COV-2 Vaccination 04/05/2020  . Pneumococcal Polysaccharide-23 06/09/2006    TDAP status: Due, Education has been provided regarding the importance of this vaccine. Advised may receive this vaccine at local pharmacy or Health Dept. Aware to provide a copy of the vaccination record if obtained from local pharmacy or Health Dept. Verbalized acceptance and understanding.  Flu Vaccine status: Up to date  Pneumococcal vaccine status: Due, Education has been provided regarding the importance of this vaccine. Advised may receive this vaccine at local pharmacy or Health Dept. Aware to provide a copy of the vaccination record if obtained from local pharmacy or Health Dept.  Verbalized acceptance and understanding.  Covid-19 vaccine status: Completed vaccines  Qualifies for Shingles Vaccine? Yes   Zostavax completed No   Shingrix Completed?: No.    Education has been provided regarding the importance of this vaccine. Patient has been advised to call insurance company to determine out of pocket expense if they have not yet received this vaccine. Advised may also receive vaccine at local pharmacy or Health Dept. Verbalized acceptance and understanding.  Screening Tests Health Maintenance  Topic Date Due  . TETANUS/TDAP  Never done  . PNA vac Low Risk Adult (2 of 2 - PCV13) 06/10/2007  . COVID-19 Vaccine (2 - Booster for [employer] series) 05/31/2020  . INFLUENZA VACCINE  06/23/2020  . HPV VACCINES  Aged Out    Health Maintenance  Health Maintenance Due  Topic Date Due  . TETANUS/TDAP  Never done  . PNA vac Low Risk Adult (2 of 2 - PCV13) 06/10/2007  . COVID-19 Vaccine (2 - Booster for [employer] series) 05/31/2020  . INFLUENZA VACCINE  06/23/2020    Colorectal cancer screening: No longer required.   Lung Cancer Screening: (Low Dose CT Chest recommended if Age 57-80 years, 30 pack-year currently smoking OR have quit w/in 15years.) does not qualify.   Lung Cancer Screening Referral: no  Additional Screening:  Hepatitis C Screening: does not qualify; Completed no  Vision Screening: Recommended annual ophthalmology exams for early detection of glaucoma and other disorders of the eye. Is the patient up to date with their annual eye exam?  No  Who is the provider or what is the name of the office in which the patient attends annual eye exams? No eye exam in years If pt is not established with a provider, would they like to be referred to a provider to establish care? No .   Dental Screening: Recommended annual dental exams for proper oral hygiene  Community Resource Referral / Chronic Care Management: CRR required this visit?  No   CCM required this  visit?  No      Plan:     I have personally reviewed and noted the following in the patient's chart:   . Medical and social history . Use of alcohol,  tobacco or illicit drugs  . Current medications and supplements . Functional ability and status . Nutritional status . Physical activity . Advanced directives . List of other physicians . Hospitalizations, surgeries, and ER visits in previous 12 months . Vitals . Screenings to include cognitive, depression, and falls . Referrals and appointments  In addition, I have reviewed and discussed with patient certain preventive protocols, quality metrics, and best practice recommendations. A written personalized care plan for preventive services as well as general preventive health recommendations were provided to patient.     Sheral Flow, LPN   [id number]   Nurse Notes:  Medications reviewed with patient; no opioid use noted.

## 2021-02-10 NOTE — Addendum Note (Signed)
Addended by: Boris Lown B on: 02/10/2021 03:20 PM   Modules accepted: Orders

## 2021-02-10 NOTE — Assessment & Plan Note (Signed)
Chronic Check lipid panel  Continue pravastatin 80 mg daily healthy diet encouraged

## 2021-02-10 NOTE — Assessment & Plan Note (Signed)
Chronic BP well controlled Continue amlodipine 10 mg daily cmp  

## 2021-02-10 NOTE — Assessment & Plan Note (Signed)
Chronic Controlled, stable Continue xanax 0.25 mg Q 6 hr prn

## 2021-02-11 ENCOUNTER — Other Ambulatory Visit: Payer: Self-pay | Admitting: Internal Medicine

## 2021-02-24 DIAGNOSIS — R350 Frequency of micturition: Secondary | ICD-10-CM | POA: Diagnosis not present

## 2021-03-20 ENCOUNTER — Other Ambulatory Visit: Payer: Self-pay | Admitting: Internal Medicine

## 2021-04-05 ENCOUNTER — Other Ambulatory Visit: Payer: Self-pay | Admitting: Internal Medicine

## 2021-05-05 ENCOUNTER — Other Ambulatory Visit: Payer: Self-pay

## 2021-05-05 ENCOUNTER — Encounter: Payer: Self-pay | Admitting: Podiatry

## 2021-05-05 ENCOUNTER — Ambulatory Visit (INDEPENDENT_AMBULATORY_CARE_PROVIDER_SITE_OTHER): Payer: Medicare Other | Admitting: Podiatry

## 2021-05-05 DIAGNOSIS — M79675 Pain in left toe(s): Secondary | ICD-10-CM

## 2021-05-05 DIAGNOSIS — M79674 Pain in right toe(s): Secondary | ICD-10-CM | POA: Diagnosis not present

## 2021-05-05 DIAGNOSIS — B351 Tinea unguium: Secondary | ICD-10-CM | POA: Diagnosis not present

## 2021-05-12 NOTE — Progress Notes (Signed)
Subjective: 87 y.o. returns the office today for painful, elongated, thickened toenails he they cannot trim himself. No open lesion. Denies any systemic complaints such as fevers, chills, nausea, vomiting.   PCP: Binnie Rail, MD  A1c: 6.3 on 02/10/2021  Objective: NAD- niece present  DP/PT pulses palpable, CRT less than 3 seconds Nails hypertrophic, dystrophic, elongated, brittle, discolored 10. There is tenderness overlying the nails 1-5 bilaterally. There is no surrounding erythema or drainage along the nail sites. No open lesions or pre-ulcerative lesions are identified. No other areas of tenderness bilateral lower extremities. No overlying edema, erythema, increased warmth. No pain with calf compression, swelling, warmth, erythema.  Assessment: Patient presents with symptomatic onychomycosis  Plan: -Treatment options including alternatives, risks, complications were discussed -Nails sharply debrided 10 without complication/bleeding. -Discussed daily foot inspection. If there are any changes, to call the office immediately.  -Follow-up in 3 months or sooner if any problems are to arise. In the meantime, encouraged to call the office with any questions, concerns, changes symptoms.  Celesta Gentile, DPM

## 2021-05-22 ENCOUNTER — Telehealth: Payer: Self-pay | Admitting: Internal Medicine

## 2021-05-22 NOTE — Chronic Care Management (AMB) (Signed)
  Chronic Care Management   Outreach Note  05/22/2021 Name: Steven Matthews MRN: [id number] DOB: 1931/04/17  Referred by: Binnie Rail, MD Reason for referral : No chief complaint on file.   An unsuccessful telephone outreach was attempted today. The patient was referred to the pharmacist for assistance with care management and care coordination.   Follow Up Plan:   Lauretta Grill Upstream Scheduler

## 2021-05-22 NOTE — Chronic Care Management (AMB) (Signed)
  Chronic Care Management   Note  05/22/2021 Name: Steven Matthews MRN: [id number] DOB: [birth year]  Steven Matthews is a 87 y.o. year old male who is a primary care patient of Burns, Claudina Lick, MD. I reached out to Lorra Hals by phone today in response to a referral sent by Steven Matthews's PCP, Binnie Rail, MD.   Mr. Degroote was given information about Chronic Care Management services today including:  CCM service includes personalized support from designated clinical staff supervised by his physician, including individualized plan of care and coordination with other care providers 24/7 contact phone numbers for assistance for urgent and routine care needs. Service will only be billed when office clinical staff spend 20 minutes or more in a month to coordinate care. Only one practitioner may furnish and Stephone the service in a calendar month. The patient may stop CCM services at any time (effective at the end of the month) by phone call to the office staff.   Patient agreed to services and verbal consent obtained.   Follow up plan:   Lauretta Grill Upstream Scheduler

## 2021-05-22 NOTE — Progress Notes (Signed)
error 

## 2021-06-18 ENCOUNTER — Other Ambulatory Visit: Payer: Self-pay | Admitting: Internal Medicine

## 2021-06-20 ENCOUNTER — Telehealth: Payer: Self-pay

## 2021-06-20 NOTE — Chronic Care Management (AMB) (Signed)
    Chronic Care Management Pharmacy Assistant   Name: Steven Matthews  MRN: [id number] DOB: [birth year]  Steven Matthews is an 87 y.o. year old male who presents for his initial CCM visit with the clinical pharmacist.   Recent office visits:  02/10/21-Stacy Lorretta Harp, MD (PCP) General follow up. Labs ordered. Follow up in 6 months.  Recent consult visits:  05/05/21-Matthew R. Wagoner DPM (Podiatry) Seen for a nail problem. Follow up in 3 months. 02/24/21-Theodore Manny (Urology) Notes no available. 02/03/21-Matthew R. Wagoner DPM (Podiatry) Seen for a nail problem. Follow up in 3 months. Hospital visits:  None in previous 6 months  Medications: Outpatient Encounter Medications as of 06/20/2021  Medication Sig   ALPRAZolam (XANAX) 0.25 MG tablet TAKE 1 TABLET(0.25 MG) BY MOUTH EVERY 6 HOURS AS NEEDED   amLODipine (NORVASC) 10 MG tablet TAKE 1 TABLET(10 MG) BY MOUTH DAILY   levothyroxine (SYNTHROID) 75 MCG tablet TAKE 1 TABLET BY MOUTH DAILY   pantoprazole (PROTONIX) 40 MG tablet TAKE 1 TABLET BY MOUTH TWICE DAILY 30 MINUTES BEFORE BREAKFAST AND DINNER   pravastatin (PRAVACHOL) 80 MG tablet TAKE 1 TABLET(80 MG) BY MOUTH AT BEDTIME   tamsulosin (FLOMAX) 0.4 MG CAPS capsule TAKE 1 CAPSULE(0.4 MG) BY MOUTH DAILY AFTER SUPPER   No facility-administered encounter medications on file as of 06/20/2021.   ALPRAZolam (XANAX) 0.25 MG tablet Last filled:06/10/21 15 DS AmLODipine (NORVASC) 10 MG tablet Last filled:06/18/21 90 DS Levothyroxine (SYNTHROID) 75 MCG tablet Last filled:06/18/21 90 DS Pantoprazole (PROTONIX) 40 MG tablet Last filled:06/18/21 90 DS Pravastatin (PRAVACHOL) 80 MG tablet Last filled:06/18/21 90 DS Tamsulosin (FLOMAX) 0.4 MG CAPS capsule Last filled:06/18/21 90 DS   Star Rating Drugs: Pravastatin (PRAVACHOL) 80 MG tablet Last filled:06/18/21 90 DS  Myriam Elta [place], [place]

## 2021-07-03 ENCOUNTER — Other Ambulatory Visit: Payer: Self-pay

## 2021-07-03 ENCOUNTER — Ambulatory Visit (INDEPENDENT_AMBULATORY_CARE_PROVIDER_SITE_OTHER): Payer: Medicare Other

## 2021-07-03 DIAGNOSIS — K219 Gastro-esophageal reflux disease without esophagitis: Secondary | ICD-10-CM

## 2021-07-03 DIAGNOSIS — I10 Essential (primary) hypertension: Secondary | ICD-10-CM | POA: Diagnosis not present

## 2021-07-03 DIAGNOSIS — E039 Hypothyroidism, unspecified: Secondary | ICD-10-CM | POA: Diagnosis not present

## 2021-07-03 DIAGNOSIS — E7849 Other hyperlipidemia: Secondary | ICD-10-CM | POA: Diagnosis not present

## 2021-07-03 DIAGNOSIS — F419 Anxiety disorder, unspecified: Secondary | ICD-10-CM

## 2021-07-03 NOTE — Progress Notes (Signed)
Chronic Care Management Pharmacy Note  07/03/2021 Name:  Steven Matthews MRN:  [id number] DOB:  10-13-1931  Summary:  - Spoke with Velva Harman who is patient's niece and POA - reports that patient is doing well, denies any issues or concerns with current medications, BP well controlled in office, LDL, TSH, and anxiety are controlled   Recommendations/Changes made from today's visit: - Recommending no changes to current medications, niece is agreeable to monitor blood pressure once weekly and reach out should BP be elevated from goal - Discussed with niece about avoiding NSAIDS for aches/ pains, advised for use of tylenol for pain (up to 3g daily) and adequate BP control to help prevent worsening kidney function   Subjective: Steven Matthews is an 87 y.o. year old male who is a primary patient of Burns, Claudina Lick, MD.  The CCM team was consulted for assistance with disease management and care coordination needs.    Engaged with patient by telephone for initial visit in response to provider referral for pharmacy case management and/or care coordination services.   Consent to Services:  The patient was given the following information about Chronic Care Management services today, agreed to services, and gave verbal consent: 1. CCM service includes personalized support from designated clinical staff supervised by the primary care provider, including individualized plan of care and coordination with other care providers 2. 24/7 contact phone numbers for assistance for urgent and routine care needs. 3. Service will only be billed when office clinical staff spend 20 minutes or more in a month to coordinate care. 4. Only one practitioner may furnish and Keondre the service in a calendar month. 5.The patient may stop CCM services at any time (effective at the end of the month) by phone call to the office staff. 6. The patient will be responsible for cost sharing (co-pay) of up to 20% of the service fee (after annual deductible  is met). Patient agreed to services and consent obtained.  Patient Care Team: Binnie Rail, MD as PCP - General (Internal Medicine) Trula Slade, DPM as Consulting Physician (Podiatry) Tanda Rockers, MD as Consulting Physician (Pulmonary Disease) Mauri Pole, MD as Consulting Physician (Gastroenterology) Delice Bison, Darnelle Maffucci, [facility] as Pharmacist (Pharmacist)  Recent office visits:  02/10/21-Stacy Lorretta Harp, MD (PCP) General follow up. Labs ordered. Follow up in 6 months - medications continued    Recent consult visits:  05/05/21-Matthew R. Wagoner DPM (Podiatry) Seen for a nail problem. Follow up in 3 months. 02/24/21-Theodore Manny (Urology) Notes no available. 02/03/21-Matthew R. Wagoner DPM (Podiatry) Seen for a nail problem. Follow up in 3 months. Hospital visits:  None in previous 6 months  Objective:  Lab Results  Component Value Date   CREATININE 1.52 (H) 02/10/2021   BUN 16 02/10/2021   GFR 40.80 (L) 02/10/2021   GFRNONAA 31 (L) 08/12/2020   GFRAA 36 (L) 08/12/2020   NA 140 02/10/2021   K 4.4 02/10/2021   CALCIUM 9.7 02/10/2021   CO2 28 02/10/2021   GLUCOSE 93 02/10/2021    Lab Results  Component Value Date/Time   HGBA1C 6.3 02/10/2021 03:20 PM   HGBA1C 6.1 (H) 08/12/2020 02:08 PM   GFR 40.80 (L) 02/10/2021 03:20 PM   GFR 53.15 (L) 02/13/2020 03:57 PM    Last diabetic Eye exam:  No results found for: HMDIABEYEEXA  Last diabetic Foot exam:  No results found for: HMDIABFOOTEX   Lab Results  Component Value Date   CHOL 151 02/10/2021   HDL 53.30  02/10/2021   LDLCALC 77 02/10/2021   TRIG 103.0 02/10/2021   CHOLHDL 3 02/10/2021    Hepatic Function Latest Ref Rng & Units 02/10/2021 08/12/2020 02/13/2020  Total Protein 6.0 - 8.3 g/dL 8.2 8.6(H) 8.2  Albumin 3.5 - 5.2 g/dL 4.4 - 3.9  AST 0 - 37 U/L $Remo'17 22 21  'mrfMg$ ALT 0 - 53 U/L $Remo'12 11 17  'IoPVd$ Alk Phosphatase 39 - 117 U/L 66 - 105  Total Bilirubin 0.2 - 1.2 mg/dL 0.9 1.0 0.6    Lab Results  Component  Value Date/Time   TSH 3.19 02/10/2021 03:20 PM   TSH 3.57 08/12/2020 02:08 PM    CBC Latest Ref Rng & Units 08/12/2020 02/13/2020 02/01/2020  WBC 3.8 - 10.8 Thousand/uL 7.2 6.4 11.6(H)  Hemoglobin 13.2 - 17.1 g/dL 13.9 12.1(L) 11.0(L)  Hematocrit 38.5 - 50.0 % 42.5 36.5(L) 33.1(L)  Platelets 140 - 400 Thousand/uL 188 372.0 279.0    No results found for: VD25OH  Clinical ASCVD: No  The ASCVD Risk score Mikey Bussing DC Jr., et al., 2013) failed to calculate for the following reasons:   The 2013 ASCVD risk score is only valid for ages 38 to 81    Depression screen PHQ 2/9 02/10/2021 08/22/2018 08/16/2017  Decreased Interest 0 0 0  Down, Depressed, Hopeless 0 0 0  PHQ - 2 Score 0 0 0  Altered sleeping - 0 -  Tired, decreased energy - 0 -  Change in appetite - 0 -  Feeling bad or failure about yourself  - 0 -  Trouble concentrating - 0 -  Moving slowly or fidgety/restless - 0 -  Suicidal thoughts - 0 -  PHQ-9 Score - 0 -    Social History   Tobacco Use  Smoking Status Never  Smokeless Tobacco Former   Types: Snuff   Quit date: 11/23/2013   BP Readings from Last 3 Encounters:  02/10/21 130/78  02/10/21 130/78  08/12/20 128/70   Pulse Readings from Last 3 Encounters:  02/10/21 78  02/10/21 78  08/12/20 82   Wt Readings from Last 3 Encounters:  02/10/21 163 lb 3.2 oz (74 kg)  02/10/21 163 lb 3.2 oz (74 kg)  08/12/20 158 lb 6.4 oz (71.8 kg)   BMI Readings from Last 3 Encounters:  02/10/21 27.16 kg/m  02/10/21 27.16 kg/m  08/12/20 26.36 kg/m    Assessment/Interventions: Review of patient past medical history, allergies, medications, health status, including review of consultants reports, laboratory and other test data, was performed as part of comprehensive evaluation and provision of chronic care management services.   SDOH:  (Social Determinants of Health) assessments and interventions performed: Yes  SDOH Screenings   Alcohol Screen: Low Risk    Last Alcohol Screening  Score (AUDIT): 0  Depression (PHQ2-9): Low Risk    PHQ-2 Score: 0  Financial Resource Strain: Low Risk    Difficulty of Paying Living Expenses: Not hard at all  Food Insecurity: No Food Insecurity   Worried About Charity fundraiser in the Last Year: Never true   Ran Out of Food in the Last Year: Never true  Housing: Low Risk    Last Housing Risk Score: 0  Physical Activity: Inactive   Days of Exercise per Week: 0 days   Minutes of Exercise per Session: 0 min  Social Connections: Not on file  Stress: No Stress Concern Present   Feeling of Stress : Not at all  Tobacco Use: Medium Risk   Smoking Tobacco Use: Never  Smokeless Tobacco Use: Former  Transport planner Needs: No Data processing manager (Medical): No   Lack of Transportation (Non-Medical): No    CCM Care Plan  No Known Allergies  Medications Reviewed Today     Reviewed by [name], [facility] (Pharmacist) on 07/03/21 at 1539  Med List Status: <None>   Medication Order Taking? Sig Documenting Provider Last Dose Status Informant  ALPRAZolam (XANAX) 0.25 MG tablet [id number] Yes TAKE 1 TABLET(0.25 MG) BY MOUTH EVERY 6 HOURS AS NEEDED Quay Burow, Claudina Lick, MD Taking Active   amLODipine (NORVASC) 10 MG tablet [id number] Yes TAKE 1 TABLET(10 MG) BY MOUTH DAILY Burns, Claudina Lick, MD Taking Active   levothyroxine (SYNTHROID) 75 MCG tablet [id number] Yes TAKE 1 TABLET BY MOUTH DAILY Burns, Claudina Lick, MD Taking Active   pantoprazole (PROTONIX) 40 MG tablet [id number] Yes TAKE 1 TABLET BY MOUTH TWICE DAILY 30 MINUTES BEFORE BREAKFAST AND DINNER Binnie Rail, MD Taking Active   pravastatin (PRAVACHOL) 80 MG tablet [id number] Yes TAKE 1 TABLET(80 MG) BY MOUTH AT BEDTIME Binnie Rail, MD Taking Active   tamsulosin (FLOMAX) 0.4 MG CAPS capsule [id number] Yes TAKE 1 CAPSULE(0.4 MG) BY MOUTH DAILY AFTER SUPPER Binnie Rail, MD Taking Active             Patient Active Problem List   Diagnosis Date Noted   BPH with  obstruction/lower urinary tract symptoms 08/11/2020   Anemia 08/11/2020   Elevated d-dimer 02/06/2020   Prediabetes 08/19/2017   GERD (gastroesophageal reflux disease) 02/15/2017   Essential hypertension, benign 08/03/2016   Hyperlipidemia 08/03/2016   Hypothyroidism 08/03/2016   History of prostate cancer 08/03/2016   Intellectual disability 08/03/2016   Anxiety 08/03/2016   Nail hypertrophy 08/03/2016   Chronic respiratory failure with hypoxia ([place]) 04/23/2016   OSA (obstructive sleep apnea) 04/23/2016   Upper airway cough syndrome 03/25/2016    Immunization History  Administered Date(s) Administered   Fluad Quad(high Dose 65+) 02/10/2021   Influenza, High Dose Seasonal PF 08/03/2016, 08/16/2017, 08/22/2018   Influenza,inj,Quad PF,6+ Mos 10/18/2015   Janssen (J&J) SARS-COV-2 Vaccination 04/05/2020   Pneumococcal Polysaccharide-23 06/09/2006    Conditions to be addressed/monitored:  Hypertension, Hyperlipidemia, GERD, Chronic Kidney Disease, Hypothyroidism, Anxiety, and BPH  Care Plan : CCM Care Plan  Updates made by [name], RPH since 07/03/2021 12:00 AM     Problem: HTN, HLD, Anxiety, Hypothyroidism, CKD, GERD   Priority: High  Onset Date: 07/03/2021     Long-Range Goal: Disease Management   Start Date: 07/03/2021  Expected End Date: 01/03/2022  This Visit's Progress: On track  Priority: High  Note:   Current Barriers:  Unable to independently monitor therapeutic efficacy  Pharmacist Clinical Goal(s):  Patient will achieve adherence to monitoring guidelines and medication adherence to achieve therapeutic efficacy maintain control of BP, LDL, TSH, and anxiety as evidenced by next labs, BP logs, and frequency of anxiety attacks  through collaboration with PharmD and provider.   Interventions: 1:1 collaboration with Binnie Rail, MD regarding development and update of comprehensive plan of care as evidenced by provider attestation and  co-signature Inter-disciplinary care team collaboration (see longitudinal plan of care) Comprehensive medication review performed; medication list updated in electronic medical record  Hypertension (BP goal <140/90) -Controlled -Current treatment: Amlodipine 10mg  - 1 tablet daily  -Medications previously tried: hctz, irbesartan, valsartan  -Current home readings: n/a at this time, most recent office visits in range and at goal BP Readings from Last 3 Encounters:  02/10/21 130/78  02/10/21 130/78  08/12/20 128/70  -Current dietary habits: reports to sodium reduced diet  -Current exercise habits: limited due to age, does not have any scheduled exercise or activity -Denies hypotensive/hypertensive symptoms -Educated on BP goals and benefits of medications for prevention of heart attack, stroke and kidney damage; Daily salt intake goal < 2300 mg; Importance of home blood pressure monitoring; Proper BP monitoring technique; Symptoms of hypotension and importance of maintaining adequate hydration; -Counseled to monitor BP at home weekly, document, and provide log at future appointments -Counseled on diet and exercise extensively Recommended to continue current medication  Hyperlipidemia: (LDL goal < 100) -Controlled Lab Results  Component Value Date   LDLCALC 77 02/10/2021  -Current treatment: Pravastatin 80mg  - 1 tablet daily  -Medications previously tried: n/a  -Current dietary patterns: reports to diet that is low in high cholesterol foods -Current exercise habits: no scheduled exercise or activity at this time -Educated on Cholesterol goals;  Benefits of statin for ASCVD risk reduction; Importance of limiting foods high in cholesterol; -Counseled on diet and exercise extensively Recommended to continue current medication  Anxiety (Goal: prevention/ control of anxiety attacks) -Controlled -Current treatment: Alprazolam 0.25mg  - 1 tablet every 6 hours as needed  - usually  only taking once daily  -Medications previously tried/failed: n/a -GAD7: unable to be completed, spoke with patient's niece, did not speak with patient to be able to complete  -Educated on Benefits of medication for symptom control -Recommended to continue current medication - discussed using the lowest dose of medication to keep anxiety under control, patient using appropriately -using only as needed and no more than prescribed   Hypothyroidism (Goal: Maintenance of euthyroid levels) -Controlled Lab Results  Component Value Date   TSH 3.19 02/10/2021  -Current treatment  Levothyroxine 15mcg - 1 tablet daily  -Medications previously tried: n/a  -Recommended to continue current medication  BPH (Goal: control/ prevention of urinary symptoms) -Controlled -Current treatment  Tamsulosin 0.4mg  - 1 capsule daily  -Medications previously tried: oxybutynin  -Recommended to continue current medication  GERD (Goal: Control/ prevention of acid reflux) -Controlled -Current treatment  Pantoprazole 40mg  - 1 tablet twice daily  -Medications previously tried: ranitidine  -Recommended to continue current medication  Chronic Kidney Disease (Goal: Prevention of disease progression) -Controlled Last eGFR 40.80 mL/min - 02/10/2021 Last CrCl 34.49mL/min  -Counseled on avoidance of nephrotoxic medications and adequate blood pressure control to prevent kidney damage and maintain current status   Patient Goals/Self-Care Activities Patient will:  - take medications as prescribed check blood pressure once weekly, document, and provide at future appointments  Follow Up Plan: Telephone follow up appointment with care management team member scheduled for: 6 months The patient has been provided with contact information for the care management team and has been advised to call with any health related questions or concerns.        Medication Assistance: None required.  Patient affirms current coverage  meets needs.  Patient's preferred pharmacy is:  [id number] [address], [address] [place] [address] Phone: [phone] Fax: [phone]   Uses pill box? Yes Pt endorses 100% compliance  Care Plan and Follow Up Patient Decision:  Patient agrees to Care Plan and Follow-up.  Plan: Telephone follow up appointment with care management team member scheduled for:  6 months and The patient has been provided with contact information for the  care management team and has been advised to call with any health related questions or concerns.   [name], PharmD Clinical Pharmacist, [place]

## 2021-07-03 NOTE — Patient Instructions (Signed)
Visit Information   PATIENT GOALS:   Goals Addressed             This Visit's Progress    Track and Manage My Blood Pressure-Hypertension       Timeframe:  Long-Range Goal Priority:  High Start Date: 07/03/2021                            Expected End Date:  01/03/2022                     Follow Up Date 01/02/2022   - check blood pressure weekly - choose a place to take my blood pressure (home, clinic or office, retail store) - write blood pressure results in a log or diary    Why is this important?   You won't feel high blood pressure, but it can still hurt your blood vessels.  High blood pressure can cause heart or kidney problems. It can also cause a stroke.  Making lifestyle changes like losing a little weight or eating less salt will help.  Checking your blood pressure at home and at different times of the day can help to control blood pressure.  If the doctor prescribes medicine remember to take it the way the doctor ordered.  Call the office if you cannot afford the medicine or if there are questions about it.        Track and Manage My Symptoms-Chronic Kidney       Timeframe:  Long-Range Goal Priority:  Medium Start Date:   07/03/2022                          Expected End Date:  01/03/2022                     Follow Up Date 01/02/2022   - get outdoors every day (weather permitting) - keep all lab appointments - make shared treatment decisions with doctor - pace activity allowing for rest - watch for early signs of feeling worse    Why is this important?   Keeping track of symptoms can help you feel the best. It also helps the doctor stay on top of any changes to the disease. It may also help keep your disease from getting worse.         Consent to CCM Services: Steven Matthews was given information about Chronic Care Management services including:  CCM service includes personalized support from designated clinical staff supervised by his physician, including  individualized plan of care and coordination with other care providers 24/7 contact phone numbers for assistance for urgent and routine care needs. Service will only be billed when office clinical staff spend 20 minutes or more in a month to coordinate care. Only one practitioner may furnish and Steven Matthews the service in a calendar month. The patient may stop CCM services at any time (effective at the end of the month) by phone call to the office staff. The patient will be responsible for cost sharing (co-pay) of up to 20% of the service fee (after annual deductible is met).  Patient agreed to services and verbal consent obtained.   Patient verbalizes understanding of instructions provided today and agrees to view in [place].   Telephone follow up appointment with care management team member scheduled for: 6 months The patient has been provided with contact information for the care management team and has  been advised to call with any health related questions or concerns.   Steven Matthews, PharmD Clinical Pharmacist, [place]    CLINICAL CARE PLAN: Patient Care Plan: CCM Care Plan     Problem Identified: HTN, HLD, Anxiety, Hypothyroidism, CKD, GERD   Priority: High  Onset Date: 07/03/2021     Long-Range Goal: Disease Management   Start Date: 07/03/2021  Expected End Date: 01/03/2022  This Visit's Progress: On track  Priority: High  Note:   Current Barriers:  Unable to independently monitor therapeutic efficacy  Pharmacist Clinical Goal(s):  Patient will achieve adherence to monitoring guidelines and medication adherence to achieve therapeutic efficacy maintain control of BP, LDL, TSH, and anxiety as evidenced by next labs, BP logs, and frequency of anxiety attacks  through collaboration with PharmD and provider.   Interventions: 1:1 collaboration with Binnie Rail, MD regarding development and update of comprehensive plan of care as evidenced by provider attestation and  co-signature Inter-disciplinary care team collaboration (see longitudinal plan of care) Comprehensive medication review performed; medication list updated in electronic medical record  Hypertension (BP goal <140/90) -Controlled -Current treatment: Amlodipine 56m - 1 tablet daily  -Medications previously tried: hctz, irbesartan, valsartan  -Current home readings: n/a at this time, most recent office visits in range and at goal BP Readings from Last 3 Encounters:  02/10/21 130/78  02/10/21 130/78  08/12/20 128/70  -Current dietary habits: reports to sodium reduced diet  -Current exercise habits: limited due to age, does not have any scheduled exercise or activity -Denies hypotensive/hypertensive symptoms -Educated on BP goals and benefits of medications for prevention of heart attack, stroke and kidney damage; Daily salt intake goal < 2300 mg; Importance of home blood pressure monitoring; Proper BP monitoring technique; Symptoms of hypotension and importance of maintaining adequate hydration; -Counseled to monitor BP at home weekly, document, and provide log at future appointments -Counseled on diet and exercise extensively Recommended to continue current medication  Hyperlipidemia: (LDL goal < 100) -Controlled Lab Results  Component Value Date   LDLCALC 77 02/10/2021  -Current treatment: Pravastatin 857m- 1 tablet daily  -Medications previously tried: n/a  -Current dietary patterns: reports to diet that is low in high cholesterol foods -Current exercise habits: no scheduled exercise or activity at this time -Educated on Cholesterol goals;  Benefits of statin for ASCVD risk reduction; Importance of limiting foods high in cholesterol; -Counseled on diet and exercise extensively Recommended to continue current medication  Anxiety (Goal: prevention/ control of anxiety attacks) -Controlled -Current treatment: Alprazolam 0.2519m 1 tablet every 6 hours as needed  - usually  only taking once daily  -Medications previously tried/failed: n/a -GAD7: unable to be completed, spoke with patient's niece, did not speak with patient to be able to complete  -Educated on Benefits of medication for symptom control -Recommended to continue current medication - discussed using the lowest dose of medication to keep anxiety under control, patient using appropriately -using only as needed and no more than prescribed   Hypothyroidism (Goal: Maintenance of euthyroid levels) -Controlled Lab Results  Component Value Date   TSH 3.19 02/10/2021  -Current treatment  Levothyroxine 40m53m 1 tablet daily  -Medications previously tried: n/a  -Recommended to continue current medication  BPH (Goal: control/ prevention of urinary symptoms) -Controlled -Current treatment  Tamsulosin 0.4mg 20m capsule daily  -Medications previously tried: oxybutynin  -Recommended to continue current medication  GERD (Goal: Control/ prevention of acid reflux) -Controlled -Current treatment  Pantoprazole 40mg 86m  1 tablet twice daily  -Medications previously tried: ranitidine  -Recommended to continue current medication  Chronic Kidney Disease (Goal: Prevention of disease progression) -Controlled Last eGFR 40.80 mL/min - 02/10/2021 Last CrCl 34.71m/min  -Counseled on avoidance of nephrotoxic medications and adequate blood pressure control to prevent kidney damage and maintain current status   Patient Goals/Self-Care Activities Patient will:  - take medications as prescribed check blood pressure once weekly, document, and provide at future appointments  Follow Up Plan: Telephone follow up appointment with care management team member scheduled for: 6 months The patient has been provided with contact information for the care management team and has been advised to call with any health related questions or concerns.

## 2021-08-10 ENCOUNTER — Encounter: Payer: Self-pay | Admitting: Internal Medicine

## 2021-08-10 NOTE — Progress Notes (Signed)
Subjective:    Patient ID: Steven Matthews, male    DOB: Jan 03, 1931, 87 y.o.   MRN: [id number]  This visit occurred during the SARS-CoV-2 public health emergency.  Safety protocols were in place, including screening questions prior to the visit, additional usage of staff PPE, and extensive cleaning of exam room while observing appropriate contact time as indicated for disinfecting solutions.     HPI The patient is here for follow up of their chronic medical problems, including htn, hichol, hypothyroidism, GERD, prediabetes, anxiety, BPH, chronic dry cough.  He is here with his niece.   He is a poor historian and his niece does help with history.  Chronic cough unchanged.    He has had left wrist pain x 3 weeks.  It was swollen.  He has been taking ibuprofen and it has helped the swelling.  It is still painful.  No injury.   No h/o gout.     Medications and allergies reviewed with patient and updated if appropriate.  Patient Active Problem List   Diagnosis Date Noted   BPH with obstruction/lower urinary tract symptoms 08/11/2020   Anemia 08/11/2020   Elevated d-dimer 02/06/2020   Prediabetes 08/19/2017   GERD (gastroesophageal reflux disease) 02/15/2017   Essential hypertension, benign 08/03/2016   Hyperlipidemia 08/03/2016   Hypothyroidism 08/03/2016   History of prostate cancer 08/03/2016   Intellectual disability 08/03/2016   Anxiety 08/03/2016   Nail hypertrophy 08/03/2016   Chronic respiratory failure with hypoxia ([place]) 04/23/2016   OSA (obstructive sleep apnea) 04/23/2016   Upper airway cough syndrome - chronic 03/25/2016    No current outpatient medications on file prior to visit.   No current facility-administered medications on file prior to visit.    Past Medical History:  Diagnosis Date   Hyperlipidemia    Hypertension    Prostate cancer [facility])     Past Surgical History:  Procedure Laterality Date   None      Social History   Socioeconomic History    Marital status: Single    Spouse name: Not on file   Number of children: 0   Years of education: Not on file   Highest education level: Not on file  Occupational History   Occupation: disabled  Tobacco Use   Smoking status: Never   Smokeless tobacco: Former    Types: Snuff    Quit date: 11/23/2013  Substance and Sexual Activity   Alcohol use: No    Alcohol/week: 0.0 standard drinks   Drug use: No   Sexual activity: Not on file  Other Topics Concern   Not on file  Social History Narrative   Not on file   Social Determinants of Health   Financial Resource Strain: Low Risk    Difficulty of Paying Living Expenses: Not hard at all  Food Insecurity: No Food Insecurity   Worried About Charity fundraiser in the Last Year: Never true   [place] in the Last Year: Never true  Transportation Needs: No Transportation Needs   Lack of Transportation (Medical): No   Lack of Transportation (Non-Medical): No  Physical Activity: Inactive   Days of Exercise per Week: 0 days   Minutes of Exercise per Session: 0 min  Stress: No Stress Concern Present   Feeling of Stress : Not at all  Social Connections: Not on file    Family History  Problem Relation Age of Onset   Hypertension Mother    Stroke Mother  Colon cancer Neg Hx    Esophageal cancer Neg Hx    Stomach cancer Neg Hx    Rectal cancer Neg Hx    Liver cancer Neg Hx     Review of Systems  Constitutional:  Negative for fever.  Respiratory:  Positive for cough (chronic). Negative for shortness of breath and wheezing.   Cardiovascular:  Negative for chest pain, palpitations and leg swelling.  Neurological:  Negative for light-headedness and headaches.      Objective:   Vitals:   08/11/21 1516  BP: (!) 144/76  Pulse: 79  Temp: 98.3 F (36.8 C)  SpO2: 92%   BP Readings from Last 3 Encounters:  08/11/21 (!) 144/76  02/10/21 130/78  02/10/21 130/78   Wt Readings from Last 3 Encounters:  08/11/21 165 lb (74.8  kg)  02/10/21 163 lb 3.2 oz (74 kg)  02/10/21 163 lb 3.2 oz (74 kg)   Body mass index is 27.46 kg/m.   Physical Exam    Constitutional: Appears well-developed and well-nourished. No distress.  HENT:  Head: Normocephalic and atraumatic.  Neck: Neck supple. No tracheal deviation present. No thyromegaly present.  No cervical lymphadenopathy Cardiovascular: Normal rate, regular rhythm and normal heart sounds.   No murmur heard. No carotid bruit .  No edema Pulmonary/Chest: Effort normal and breath sounds normal. No respiratory distress. No has no wheezes. No rales. Musculoskeletal: Left wrist with minimal swelling, some pain with lateral flexion, no pain with posterior flexion or anterior flexion.  Mild tenderness thumb joint, but no pain with palpation of wrist.  Full range of motion Skin: Skin is warm and dry. Not diaphoretic.  Psychiatric: Normal mood and affect. Behavior is normal.      Assessment & Plan:    See Problem List for Assessment and Plan of chronic medical problems.

## 2021-08-10 NOTE — Patient Instructions (Addendum)
  Blood work was ordered.     Flu immunization administered today.     Medications changes include :   none  Your prescription(s) have been submitted to your pharmacy. Please take as directed and contact our office if you believe you are having problem(s) with the medication(s).   Let me know if the wrist pain does not improve.   Please followup in 6 months

## 2021-08-11 ENCOUNTER — Ambulatory Visit (INDEPENDENT_AMBULATORY_CARE_PROVIDER_SITE_OTHER): Payer: Medicare Other | Admitting: Internal Medicine

## 2021-08-11 ENCOUNTER — Other Ambulatory Visit: Payer: Self-pay

## 2021-08-11 ENCOUNTER — Encounter: Payer: Self-pay | Admitting: Podiatry

## 2021-08-11 ENCOUNTER — Ambulatory Visit (INDEPENDENT_AMBULATORY_CARE_PROVIDER_SITE_OTHER): Payer: Medicare Other | Admitting: Podiatry

## 2021-08-11 VITALS — BP 144/76 | HR 79 | Temp 98.3°F | Ht 65.0 in | Wt 165.0 lb

## 2021-08-11 DIAGNOSIS — R7303 Prediabetes: Secondary | ICD-10-CM

## 2021-08-11 DIAGNOSIS — B351 Tinea unguium: Secondary | ICD-10-CM

## 2021-08-11 DIAGNOSIS — K219 Gastro-esophageal reflux disease without esophagitis: Secondary | ICD-10-CM

## 2021-08-11 DIAGNOSIS — E039 Hypothyroidism, unspecified: Secondary | ICD-10-CM | POA: Diagnosis not present

## 2021-08-11 DIAGNOSIS — N138 Other obstructive and reflux uropathy: Secondary | ICD-10-CM | POA: Diagnosis not present

## 2021-08-11 DIAGNOSIS — M79674 Pain in right toe(s): Secondary | ICD-10-CM | POA: Diagnosis not present

## 2021-08-11 DIAGNOSIS — Z23 Encounter for immunization: Secondary | ICD-10-CM

## 2021-08-11 DIAGNOSIS — I10 Essential (primary) hypertension: Secondary | ICD-10-CM

## 2021-08-11 DIAGNOSIS — F419 Anxiety disorder, unspecified: Secondary | ICD-10-CM

## 2021-08-11 DIAGNOSIS — M25532 Pain in left wrist: Secondary | ICD-10-CM | POA: Diagnosis not present

## 2021-08-11 DIAGNOSIS — M79675 Pain in left toe(s): Secondary | ICD-10-CM

## 2021-08-11 DIAGNOSIS — E7849 Other hyperlipidemia: Secondary | ICD-10-CM | POA: Diagnosis not present

## 2021-08-11 DIAGNOSIS — N401 Enlarged prostate with lower urinary tract symptoms: Secondary | ICD-10-CM

## 2021-08-11 DIAGNOSIS — R058 Other specified cough: Secondary | ICD-10-CM

## 2021-08-11 MED ORDER — PANTOPRAZOLE SODIUM 40 MG PO TBEC: DELAYED_RELEASE_TABLET | ORAL | 1 refills | Status: DC

## 2021-08-11 MED ORDER — TAMSULOSIN HCL 0.4 MG PO CAPS: ORAL_CAPSULE | ORAL | 2 refills | Status: DC

## 2021-08-11 MED ORDER — LEVOTHYROXINE SODIUM 75 MCG PO TABS: 75.0000 ug | ORAL_TABLET | Freq: Every day | ORAL | 1 refills | Status: DC

## 2021-08-11 MED ORDER — PRAVASTATIN SODIUM 80 MG PO TABS: ORAL_TABLET | ORAL | 1 refills | Status: DC

## 2021-08-11 MED ORDER — ALPRAZOLAM 0.25 MG PO TABS: ORAL_TABLET | ORAL | 1 refills | Status: DC

## 2021-08-11 MED ORDER — AMLODIPINE BESYLATE 10 MG PO TABS: ORAL_TABLET | ORAL | 2 refills | Status: DC

## 2021-08-11 NOTE — Assessment & Plan Note (Signed)
Chronic Controlled, stable Continue xanax 0.25 mg Q 6 hr prn - his niece manages his medications

## 2021-08-11 NOTE — Addendum Note (Signed)
Addended by: Marcina Millard on: 08/11/2021 04:58 PM   Modules accepted: Orders

## 2021-08-11 NOTE — Assessment & Plan Note (Signed)
Chronic Check a1c Low sugar / carb diet 

## 2021-08-11 NOTE — Assessment & Plan Note (Signed)
Chronic Continue pravachol 80 mg qd Regular exercise and healthy diet encouraged

## 2021-08-11 NOTE — Assessment & Plan Note (Signed)
Chronic Controlled, stable Continue flomax 0.4 mg daily  

## 2021-08-11 NOTE — Assessment & Plan Note (Signed)
Chronic GERD controlled Continue pantoprazole 40 mg bid  

## 2021-08-11 NOTE — Assessment & Plan Note (Signed)
Acute Started 3 weeks ago without injury It is much improved with ibuprofen on a daily basis Because of his chronic kidney disease advised that he stop the ibuprofen Symptoms are much improved-advised diclofenac gel, ice If symptoms do not resolve or persist would recommend orts medicine for evaluation-May need ultrasound

## 2021-08-11 NOTE — Assessment & Plan Note (Signed)
Chronic  Clinically euthyroid Currently taking levothyroxine 75 mcg daily Check tsh  Titrate med dose if needed  

## 2021-08-11 NOTE — Assessment & Plan Note (Signed)
Chronic BP well controlled Continue amlodipine 10 mg qd cmp

## 2021-08-12 LAB — COMPREHENSIVE METABOLIC PANEL
ALT: 17 U/L (ref 0–53)
AST: 25 U/L (ref 0–37)
Albumin: 4.3 g/dL (ref 3.5–5.2)
Alkaline Phosphatase: 57 U/L (ref 39–117)
BUN: 17 mg/dL (ref 6–23)
CO2: 28 mEq/L (ref 19–32)
Calcium: 9.9 mg/dL (ref 8.4–10.5)
Chloride: 104 mEq/L (ref 96–112)
Creatinine, Ser: 1.58 mg/dL — ABNORMAL HIGH (ref 0.40–1.50)
GFR: 38.81 mL/min — ABNORMAL LOW (ref >60.00)
Glucose, Bld: 99 mg/dL (ref 70–99)
Potassium: 4.9 mEq/L (ref 3.5–5.1)
Sodium: 141 mEq/L (ref 135–145)
Total Bilirubin: 0.8 mg/dL (ref 0.2–1.2)
Total Protein: 8.4 g/dL — ABNORMAL HIGH (ref 6.0–8.3)

## 2021-08-12 LAB — TSH: TSH: 5.57 u[IU]/mL — ABNORMAL HIGH (ref 0.35–5.50)

## 2021-08-12 LAB — HEMOGLOBIN A1C: Hgb A1c MFr Bld: 6.4 % (ref 4.6–6.5)

## 2021-08-13 NOTE — Progress Notes (Signed)
Subjective: 87 y.o. returns the office today for painful, elongated, thickened toenails he they cannot trim himself. No open lesion. Denies any systemic complaints such as fevers, chills, nausea, vomiting.   PCP: Binnie Rail, MD last seen August 11, 2021  A1c: 6.4 on August 11, 2021  Objective: NAD- niece present  DP/PT pulses palpable, CRT less than 3 seconds Nails hypertrophic, dystrophic, elongated, brittle, discolored 10. There is tenderness overlying the nails 1-5 bilaterally. There is no surrounding erythema or drainage along the nail sites.  On the left fourth toenail part of the nail has come off there is superficial granular wound present but appears to be almost scabbed over from the nail came off.  There is no edema, erythema, drainage or pus or any signs of infection. No other open lesions or pre-ulcerative lesions are identified. No pain with calf compression, swelling, warmth, erythema.  Assessment: Patient presents with symptomatic onychomycosis  Plan: -Treatment options including alternatives, risks, complications were discussed -Nails sharply debrided 10 without complication/bleeding.  There was a superficial wound report of the nail of the left fourth toe is come off.  Recommended a small amount of antibiotic ointment dressing changes daily.  Patient was scabbed over and healing well without any signs of infection.  Monitor for signs or symptoms of infection if no healing Couple weeks if there is any worsening we know. -Discussed daily foot inspection. If there are any changes, to call the office immediately.  -Follow-up in 3 months or sooner if any problems are to arise. In the meantime, encouraged to call the office with any questions, concerns, changes symptoms.  Celesta Gentile, DPM

## 2021-08-14 MED ORDER — LEVOTHYROXINE SODIUM 88 MCG PO TABS: 88.0000 ug | ORAL_TABLET | Freq: Every day | ORAL | 3 refills | Status: DC

## 2021-08-14 NOTE — Addendum Note (Signed)
Addended by: Binnie Rail on: 08/14/2021 06:23 PM   Modules accepted: Orders

## 2021-09-26 ENCOUNTER — Telehealth: Payer: Self-pay

## 2021-09-26 NOTE — Progress Notes (Signed)
    Chronic Care Management Pharmacy Assistant   Name: Steven Matthews  MRN: [id number] DOB: September 23, 1931   Reason for Encounter: Disease State   Conditions to be addressed/monitored: General    Recent office visits:  08/11/21 Binnie Rail, MD-PCP (Essential hypertension) orders: cmp, tsh, hem a1c, Medication changes: increased levothyroxine to 42mcg  Recent consult visits:  08/11/21 Trula Slade, DPM-Podiatry (Dermatophytosis of nail) no orders or med changes  Hospital visits:  None in previous 6 months  Medications: Outpatient Encounter Medications as of 09/26/2021  Medication Sig   ALPRAZolam (XANAX) 0.25 MG tablet TAKE 1 TABLET(0.25 MG) BY MOUTH EVERY 6 HOURS AS NEEDED   amLODipine (NORVASC) 10 MG tablet TAKE 1 TABLET(10 MG) BY MOUTH DAILY   levothyroxine (SYNTHROID) 88 MCG tablet Take 1 tablet (88 mcg total) by mouth daily.   pantoprazole (PROTONIX) 40 MG tablet TAKE 1 TABLET BY MOUTH TWICE DAILY 30 MINUTES BEFORE BREAKFAST AND DINNER   pravastatin (PRAVACHOL) 80 MG tablet TAKE 1 TABLET(80 MG) BY MOUTH AT BEDTIME   tamsulosin (FLOMAX) 0.4 MG CAPS capsule TAKE 1 CAPSULE(0.4 MG) BY MOUTH DAILY AFTER SUPPER   No facility-administered encounter medications on file as of 09/26/2021.    Pharmacist Review Have you had any problems recently with your health? Patient is not having any new health issues  Have you had any problems with your pharmacy?Patient is not having any problems getting medications or the cost from the pharmacy   What issues or side effects are you having with your medications?Patient is not having any sided effects from medications  What would you like me to pass along to [place] for them to help you with? Patient reports he is doing fine  What can we do to take care of you better?Patient states not at this time  Care Gaps: Colonoscopy-NA Diabetic Foot Exam-NA Ophthalmology-NA Dexa Scan - NA Annual Well Visit - NA Micro albumin-NA Hemoglobin A1c-  08/11/21  Star Rating Drugs: Pravastatin 80 mg-last fill 06/18/21 90 ds  Ethelene Hal Clinical Pharmacist Assistant [phone]

## 2021-10-19 ENCOUNTER — Encounter: Payer: Self-pay | Admitting: Internal Medicine

## 2021-10-19 DIAGNOSIS — H9190 Unspecified hearing loss, unspecified ear: Secondary | ICD-10-CM

## 2021-10-21 MED ORDER — PRAVASTATIN SODIUM 80 MG PO TABS: ORAL_TABLET | ORAL | 1 refills | Status: DC

## 2021-10-21 NOTE — Addendum Note (Signed)
Addended by: Binnie Rail on: 10/21/2021 07:39 AM   Modules accepted: Orders

## 2021-11-10 ENCOUNTER — Encounter: Payer: Self-pay | Admitting: Podiatry

## 2021-11-10 ENCOUNTER — Other Ambulatory Visit: Payer: Self-pay

## 2021-11-10 ENCOUNTER — Ambulatory Visit (INDEPENDENT_AMBULATORY_CARE_PROVIDER_SITE_OTHER): Payer: Medicare Other | Admitting: Podiatry

## 2021-11-10 DIAGNOSIS — M79674 Pain in right toe(s): Secondary | ICD-10-CM | POA: Diagnosis not present

## 2021-11-10 DIAGNOSIS — M79675 Pain in left toe(s): Secondary | ICD-10-CM | POA: Diagnosis not present

## 2021-11-10 DIAGNOSIS — B351 Tinea unguium: Secondary | ICD-10-CM

## 2021-11-18 NOTE — Progress Notes (Signed)
°  Subjective:  Patient ID: Steven Matthews, male    DOB: 1931/04/26,  MRN: [id number]  Steven Matthews presents to clinic today for painful elongated mycotic toenails 1-5 bilaterally which are tender when wearing enclosed shoe gear. Pain is relieved with periodic professional debridement.  PCP is Binnie Rail, MD , and last visit was 08/11/2021.  No Known Allergies  Review of Systems: Negative except as noted in the HPI. Objective:   Constitutional Steven Matthews is a pleasant 87 y.o. African American male, WD, WN in NAD. AAO x 3.   Vascular CFT <3 seconds b/l LE. Palpable DP/PT pulses b/l LE. Digital hair sparse b/l. Skin temperature gradient WNL b/l. No pain with calf compression b/l. No edema noted b/l. No cyanosis or clubbing noted b/l LE.  Neurologic Normal speech. Oriented to person, place, and time. Protective sensation intact 5/5 intact bilaterally with 10g monofilament b/l.  Dermatologic Pedal integument with normal turgor, texture and tone b/l LE. No open wounds b/l. No interdigital macerations b/l. Toenails 1-5 b/l elongated, thickened, discolored with subungual debris. +Tenderness with dorsal palpation of nailplates. No hyperkeratotic or porokeratotic lesions present.  Orthopedic: Muscle strength 5/5 to all lower extremity muscle groups bilaterally. No pain, crepitus or joint limitation noted with ROM bilateral LE.   Radiographs: None  Last A1c:  Hemoglobin A1C Latest Ref Rng & Units 08/11/2021 02/10/2021  HGBA1C 4.6 - 6.5 % 6.4 6.3  Some recent data might be hidden   Assessment:   1. Pain due to onychomycosis of toenails of both feet    Plan:  Patient was evaluated and treated and all questions answered. Consent given for treatment as described below: -Examined patient. -Patient to continue soft, supportive shoe gear daily. -Mycotic toenails 1-5 bilaterally were debrided in length and girth with sterile nail nippers and dremel without incident. -Patient to report any pedal injuries  to medical professional immediately. -Patient/POA to call should there be question/concern in the interim.  Return in about 3 months (around 02/08/2022).  Marzetta Board, DPM

## 2021-11-29 ENCOUNTER — Other Ambulatory Visit: Payer: Self-pay | Admitting: Internal Medicine

## 2022-01-02 ENCOUNTER — Telehealth: Payer: Medicare Other

## 2022-02-08 ENCOUNTER — Encounter: Payer: Self-pay | Admitting: Internal Medicine

## 2022-02-08 DIAGNOSIS — N1832 Chronic kidney disease, stage 3b: Secondary | ICD-10-CM | POA: Insufficient documentation

## 2022-02-08 DIAGNOSIS — N183 Chronic kidney disease, stage 3 unspecified: Secondary | ICD-10-CM | POA: Insufficient documentation

## 2022-02-08 NOTE — Progress Notes (Signed)
? ? ? ? ?Subjective:  ? ? Patient ID: Steven Matthews, male    DOB: 30-Aug-1931, 88 y.o.   MRN: [id number] ? ?This visit occurred during the SARS-CoV-2 public health emergency.  Safety protocols were in place, including screening questions prior to the visit, additional usage of staff PPE, and extensive cleaning of exam room while observing appropriate contact time as indicated for disinfecting solutions.   ? ? ?HPI ?Steven Matthews is here for follow up of his chronic medical problems, including htn, CKD, GERD, hypothyroid, hld, anxiety, prediabetes, BPH.  He is here with his niece who provides the history. ? ?He is taking all of his medications as prescribed.  His niece manages them.  Neither one has any concerns.   ? ?Medications and allergies reviewed with patient and updated if appropriate. ? ?Current Outpatient Medications on File Prior to Visit  ?Medication Sig Dispense Refill  ? ALPRAZolam (XANAX) 0.25 MG tablet TAKE 1 TABLET(0.25 MG) BY MOUTH EVERY 6 HOURS AS NEEDED 60 tablet 5  ? amLODipine (NORVASC) 10 MG tablet TAKE 1 TABLET(10 MG) BY MOUTH DAILY 90 tablet 2  ? levothyroxine (SYNTHROID) 88 MCG tablet Take 1 tablet (88 mcg total) by mouth daily. 90 tablet 3  ? pantoprazole (PROTONIX) 40 MG tablet TAKE 1 TABLET BY MOUTH TWICE DAILY 30 MINUTES BEFORE BREAKFAST AND DINNER 180 tablet 1  ? pravastatin (PRAVACHOL) 80 MG tablet TAKE 1 TABLET(80 MG) BY MOUTH AT BEDTIME 90 tablet 1  ? tamsulosin (FLOMAX) 0.4 MG CAPS capsule TAKE 1 CAPSULE(0.4 MG) BY MOUTH DAILY AFTER SUPPER 90 capsule 2  ? ?No current facility-administered medications on file prior to visit.  ? ? ? ?Review of Systems  ?Constitutional:  Negative for fever.  ?Respiratory:  Positive for cough (chronic). Negative for shortness of breath and wheezing.   ?Cardiovascular:  Negative for chest pain, palpitations and leg swelling.  ?Gastrointestinal:  Negative for abdominal pain, constipation and diarrhea.  ?Genitourinary:  Negative for difficulty urinating.  ?Neurological:   Negative for light-headedness and headaches.  ? ?   ?Objective:  ? ?Vitals:  ? 02/09/22 1432  ?BP: 132/80  ?Pulse: 78  ?Temp: 98.4 ?F (36.9 ?C)  ?SpO2: 95%  ? ?BP Readings from Last 3 Encounters:  ?02/09/22 132/80  ?08/11/21 (!) 144/76  ?02/10/21 130/78  ? ?Wt Readings from Last 3 Encounters:  ?02/09/22 154 lb 6.4 oz (70 kg)  ?08/11/21 165 lb (74.8 kg)  ?02/10/21 163 lb 3.2 oz (74 kg)  ? ?Body mass index is 25.69 kg/m?. ? ?  ?Physical Exam ?Constitutional:   ?   General: He is not in acute distress. ?   Appearance: Normal appearance. He is not ill-appearing.  ?HENT:  ?   Head: Normocephalic and atraumatic.  ?Eyes:  ?   Conjunctiva/sclera: Conjunctivae normal.  ?Cardiovascular:  ?   Rate and Rhythm: Normal rate and regular rhythm.  ?   Heart sounds: Normal heart sounds. No murmur heard. ?Pulmonary:  ?   Effort: Pulmonary effort is normal. No respiratory distress.  ?   Breath sounds: Normal breath sounds. No wheezing or rales.  ?Musculoskeletal:  ?   Right lower leg: No edema.  ?   Left lower leg: No edema.  ?Skin: ?   General: Skin is warm and dry.  ?   Findings: No rash.  ?Neurological:  ?   Mental Status: He is alert. Mental status is at baseline.  ?Psychiatric:     ?   Mood and Affect: Mood normal.  ? ?   ? ?  Lab Results  ?Component Value Date  ? WBC 7.2 08/12/2020  ? HGB 13.9 08/12/2020  ? HCT 42.5 08/12/2020  ? PLT 188 08/12/2020  ? GLUCOSE 99 08/11/2021  ? CHOL 151 02/10/2021  ? TRIG 103.0 02/10/2021  ? HDL 53.30 02/10/2021  ? [place] 77 02/10/2021  ? ALT 17 08/11/2021  ? AST 25 08/11/2021  ? NA 141 08/11/2021  ? K 4.9 08/11/2021  ? CL 104 08/11/2021  ? CREATININE 1.58 (H) 08/11/2021  ? BUN 17 08/11/2021  ? CO2 28 08/11/2021  ? TSH 5.57 (H) 08/11/2021  ? HGBA1C 6.4 08/11/2021  ? ? ? ?Assessment & Plan:  ? ? ?See Problem List for Assessment and Plan of chronic medical problems.  ? ? ?

## 2022-02-08 NOTE — Patient Instructions (Addendum)
? ? ? ?  Blood work was ordered.   ? ? ?Medications changes include :   None ? ? ?  ? ? ?Return in about 6 months (around 08/12/2022) for follow up. ? ?

## 2022-02-09 ENCOUNTER — Other Ambulatory Visit: Payer: Self-pay

## 2022-02-09 ENCOUNTER — Ambulatory Visit (INDEPENDENT_AMBULATORY_CARE_PROVIDER_SITE_OTHER): Payer: Medicare Other | Admitting: Internal Medicine

## 2022-02-09 VITALS — BP 132/80 | HR 78 | Temp 98.4°F | Ht 65.0 in | Wt 154.4 lb

## 2022-02-09 DIAGNOSIS — K219 Gastro-esophageal reflux disease without esophagitis: Secondary | ICD-10-CM | POA: Diagnosis not present

## 2022-02-09 DIAGNOSIS — N138 Other obstructive and reflux uropathy: Secondary | ICD-10-CM

## 2022-02-09 DIAGNOSIS — I10 Essential (primary) hypertension: Secondary | ICD-10-CM

## 2022-02-09 DIAGNOSIS — E039 Hypothyroidism, unspecified: Secondary | ICD-10-CM | POA: Diagnosis not present

## 2022-02-09 DIAGNOSIS — N1832 Chronic kidney disease, stage 3b: Secondary | ICD-10-CM | POA: Diagnosis not present

## 2022-02-09 DIAGNOSIS — R634 Abnormal weight loss: Secondary | ICD-10-CM | POA: Diagnosis not present

## 2022-02-09 DIAGNOSIS — F419 Anxiety disorder, unspecified: Secondary | ICD-10-CM

## 2022-02-09 DIAGNOSIS — E7849 Other hyperlipidemia: Secondary | ICD-10-CM | POA: Diagnosis not present

## 2022-02-09 DIAGNOSIS — N401 Enlarged prostate with lower urinary tract symptoms: Secondary | ICD-10-CM

## 2022-02-09 DIAGNOSIS — R7303 Prediabetes: Secondary | ICD-10-CM

## 2022-02-09 LAB — CBC WITH DIFFERENTIAL/PLATELET
Basophils Absolute: 0 10*3/uL (ref 0.0–0.1)
Basophils Relative: 0.5 % (ref 0.0–3.0)
Eosinophils Absolute: 0.1 10*3/uL (ref 0.0–0.7)
Eosinophils Relative: 1.5 % (ref 0.0–5.0)
HCT: 40.9 % (ref 39.0–52.0)
Hemoglobin: 13.6 g/dL (ref 13.0–17.0)
Lymphocytes Relative: 27.7 % (ref 12.0–46.0)
Lymphs Abs: 2.1 10*3/uL (ref 0.7–4.0)
MCHC: 33.3 g/dL (ref 30.0–36.0)
MCV: 87.1 fl (ref 78.0–100.0)
Monocytes Absolute: 0.5 10*3/uL (ref 0.1–1.0)
Monocytes Relative: 7 % (ref 3.0–12.0)
Neutro Abs: 4.7 10*3/uL (ref 1.4–7.7)
Neutrophils Relative %: 63.3 % (ref 43.0–77.0)
Platelets: 205 10*3/uL (ref 150.0–400.0)
RBC: 4.7 Mil/uL (ref 4.22–5.81)
RDW: 14.8 % (ref 11.5–15.5)
WBC: 7.5 10*3/uL (ref 4.0–10.5)

## 2022-02-09 LAB — LIPID PANEL
Cholesterol: 170 mg/dL (ref 0–200)
HDL: 60 mg/dL (ref >39.00)
LDL Cholesterol: 89 mg/dL (ref 0–99)
NonHDL: 109.56
Total CHOL/HDL Ratio: 3
Triglycerides: 101 mg/dL (ref 0.0–149.0)
VLDL: 20.2 mg/dL (ref 0.0–40.0)

## 2022-02-09 LAB — COMPREHENSIVE METABOLIC PANEL
ALT: 14 U/L (ref 0–53)
AST: 21 U/L (ref 0–37)
Albumin: 4.3 g/dL (ref 3.5–5.2)
Alkaline Phosphatase: 63 U/L (ref 39–117)
BUN: 25 mg/dL — ABNORMAL HIGH (ref 6–23)
CO2: 27 mEq/L (ref 19–32)
Calcium: 9.9 mg/dL (ref 8.4–10.5)
Chloride: 103 mEq/L (ref 96–112)
Creatinine, Ser: 1.58 mg/dL — ABNORMAL HIGH (ref 0.40–1.50)
GFR: 38.67 mL/min — ABNORMAL LOW (ref >60.00)
Glucose, Bld: 93 mg/dL (ref 70–99)
Potassium: 3.9 mEq/L (ref 3.5–5.1)
Sodium: 140 mEq/L (ref 135–145)
Total Bilirubin: 0.7 mg/dL (ref 0.2–1.2)
Total Protein: 8.1 g/dL (ref 6.0–8.3)

## 2022-02-09 LAB — HEMOGLOBIN A1C: Hgb A1c MFr Bld: 6.3 % (ref 4.6–6.5)

## 2022-02-09 LAB — TSH: TSH: 2.5 u[IU]/mL (ref 0.35–5.50)

## 2022-02-09 NOTE — Assessment & Plan Note (Signed)
Chronic CMP 

## 2022-02-09 NOTE — Assessment & Plan Note (Signed)
Chronic Blood pressure well controlled CMP Continue amlodipine 10 mg daily 

## 2022-02-09 NOTE — Assessment & Plan Note (Signed)
Chronic Controlled, Stable Continue alprazolam 0.25 mg every 6 hours as needed 

## 2022-02-09 NOTE — Assessment & Plan Note (Signed)
Chronic Regular exercise and healthy diet encouraged Check lipid panel  Continue pravastatin 80 mg daily 

## 2022-02-09 NOTE — Assessment & Plan Note (Signed)
New ?He has lost 11 lbs in the past 6 months ?His niece says his appetite is good and denies any complaints/new symptoms ?Check labs ?Monitor weight ?

## 2022-02-09 NOTE — Assessment & Plan Note (Signed)
Chronic Controlled, Stable Continue Flomax 0.4 mg daily 

## 2022-02-09 NOTE — Assessment & Plan Note (Signed)
Chronic GERD controlled Continue pantoprazole 40 mg twice daily 

## 2022-02-09 NOTE — Assessment & Plan Note (Signed)
Chronic Check a1c Low sugar / carb diet Stressed regular exercise  

## 2022-02-09 NOTE — Assessment & Plan Note (Signed)
Chronic  Clinically euthyroid Currently taking levothyroxine 88 mcg daily Check tsh  Titrate med dose if needed  

## 2022-02-16 ENCOUNTER — Other Ambulatory Visit: Payer: Self-pay

## 2022-02-16 ENCOUNTER — Ambulatory Visit (INDEPENDENT_AMBULATORY_CARE_PROVIDER_SITE_OTHER): Payer: Medicare Other | Admitting: Podiatry

## 2022-02-16 ENCOUNTER — Encounter: Payer: Self-pay | Admitting: Podiatry

## 2022-02-16 DIAGNOSIS — M79675 Pain in left toe(s): Secondary | ICD-10-CM | POA: Diagnosis not present

## 2022-02-16 DIAGNOSIS — M79674 Pain in right toe(s): Secondary | ICD-10-CM | POA: Diagnosis not present

## 2022-02-16 DIAGNOSIS — B351 Tinea unguium: Secondary | ICD-10-CM | POA: Diagnosis not present

## 2022-02-21 NOTE — Progress Notes (Signed)
?  Subjective:  ?Patient ID: Steven Matthews, male    DOB: Jul 17, 1931,  MRN: [id number] ? ?88 y.o. male presents painful elongated mycotic toenails 1-5 bilaterally which are tender when wearing enclosed shoe gear. Pain is relieved with periodic professional debridement. ? ?New problem(s): None  ? ?PCP is Binnie Rail, MD , and last visit was 02/09/2022. ? ?No Known Allergies ? ?Review of Systems: Negative except as noted in the HPI.  ? ?Objective:  ?Vascular Examination: ?CFT immediate b/l LE. Faintly palpable DP/PT pulses b/l LE. Digital hair sparse b/l. Skin temperature gradient WNL b/l. No pain with calf compression b/l. No edema noted b/l. No cyanosis or clubbing noted b/l LE. ? ?Neurological Examination: ?Sensation grossly intact b/l with 10 gram monofilament. Vibratory sensation intact b/l.  ? ?Dermatological Examination: ?Pedal skin with normal turgor, texture and tone b/l. Toenails 1-5 b/l thick, discolored, elongated with subungual debris and pain on dorsal palpation. No hyperkeratotic lesions noted b/l.  ? ?Musculoskeletal Examination: ?Muscle strength 5/5 to b/l LE. No pain, crepitus or joint limitation noted with ROM bilateral LE. No gross bony deformities bilaterally. ? ?Radiographs: None ? ?Last A1c:   ? ?  Latest Ref Rng & Units 02/09/2022  ?  2:59 PM 08/11/2021  ?  3:41 PM  ?Hemoglobin A1C  ?Hemoglobin-A1c 4.6 - 6.5 % 6.3   6.4    ? ?Assessment:  ? ?1. Pain due to onychomycosis of toenails of both feet   ? ?Plan:  ?-No new findings. No new orders. ?-Toenails 1-5 b/l were debrided in length and girth with sterile nail nippers and dremel without iatrogenic bleeding.  ?-Patient/POA to call should there be question/concern in the interim. ? ?Return in about 3 months (around 05/19/2022). ? ?Marzetta Board, DPM ?

## 2022-02-23 ENCOUNTER — Telehealth: Payer: Self-pay | Admitting: Internal Medicine

## 2022-02-23 NOTE — Telephone Encounter (Signed)
Left message for patient to call back to schedule Medicare Annual Wellness Visit  ? ?Last AWV  02/10/21 ? ?Please schedule at anytime with [facility] if patient calls the office back.   ? ? ?Any questions, please call me at [phone]  ?

## 2022-03-02 ENCOUNTER — Ambulatory Visit (INDEPENDENT_AMBULATORY_CARE_PROVIDER_SITE_OTHER): Payer: Medicare Other

## 2022-03-02 DIAGNOSIS — Z0000 Encounter for general adult medical examination without abnormal findings: Secondary | ICD-10-CM

## 2022-03-02 NOTE — Progress Notes (Signed)
?I connected with Steven Matthews today by telephone and verified that I am speaking with the correct person using two identifiers. ?Location patient: home ?Location provider: work ?Persons participating in the virtual visit: patient, Steven Matthews) and provider. ?  ?I discussed the limitations, risks, security and privacy concerns of performing an evaluation and management service by telephone and the availability of in person appointments. I also discussed with the patient that there may be a patient responsible charge related to this service. The patient expressed understanding and verbally consented to this telephonic visit.  ?  ?Interactive audio and video telecommunications were attempted between this provider and patient, however failed, due to patient having technical difficulties OR patient did not have access to video capability.  We continued and completed visit with audio only. ? ?Some vital signs may be absent or patient reported.  ? ?Time Spent with patient on telephone encounter: 30 minutes ? ?Subjective:  ? Steven Matthews is a 88 y.o. male who presents for Medicare Annual/Subsequent preventive examination. ? ?Review of Systems    ? ?Cardiac Risk Factors include: advanced age (>32mn, >>45women);dyslipidemia;hypertension;family history of premature cardiovascular disease;male gender ? ?   ?Objective:  ?  ?There were no vitals filed for this visit. ?There is no height or weight on file to calculate BMI. ? ? ?  03/02/2022  ?  3:16 PM 02/10/2021  ?  2:34 PM 08/16/2017  ? 10:30 AM 04/17/2016  ?  3:19 PM  ?Advanced Directives  ?Does Patient Have a Medical Advance Directive? Yes Yes Yes No  ?Type of [place]   ?Does patient want to make changes to medical advance directive? No - Patient declined No - Patient declined    ?Copy of [place] Chart? No - copy requested No - copy  requested No - copy requested   ? ? ?Current Medications (verified) ?Outpatient Encounter Medications as of 03/02/2022  ?Medication Sig  ? ALPRAZolam (XANAX) 0.25 MG tablet TAKE 1 TABLET(0.25 MG) BY MOUTH EVERY 6 HOURS AS NEEDED  ? amLODipine (NORVASC) 10 MG tablet TAKE 1 TABLET(10 MG) BY MOUTH DAILY  ? levothyroxine (SYNTHROID) 88 MCG tablet Take 1 tablet (88 mcg total) by mouth daily.  ? pantoprazole (PROTONIX) 40 MG tablet TAKE 1 TABLET BY MOUTH TWICE DAILY 30 MINUTES BEFORE BREAKFAST AND DINNER  ? pravastatin (PRAVACHOL) 80 MG tablet TAKE 1 TABLET(80 MG) BY MOUTH AT BEDTIME  ? tamsulosin (FLOMAX) 0.4 MG CAPS capsule TAKE 1 CAPSULE(0.4 MG) BY MOUTH DAILY AFTER SUPPER  ? ?No facility-administered encounter medications on file as of 03/02/2022.  ? ? ?Allergies (verified) ?Patient has no known allergies.  ? ?History: ?Past Medical History:  ?Diagnosis Date  ? Hyperlipidemia   ? Hypertension   ? [place]   ? ?Past Surgical History:  ?Procedure Laterality Date  ? None    ? ?Family History  ?Problem Relation Age of Onset  ? Hypertension Mother   ? Stroke Mother   ? Colon cancer Neg Hx   ? Esophageal cancer Neg Hx   ? Stomach cancer Neg Hx   ? Rectal cancer Neg Hx   ? Liver cancer Neg Hx   ? ?Social History  ? ?Socioeconomic History  ? Marital status: Single  ?  Spouse name: Not on file  ? Number of children: 0  ? Years of education: Not on file  ? Highest education level: Not  on file  ?Occupational History  ? Occupation: disabled  ?Tobacco Use  ? Smoking status: Never  ? Smokeless tobacco: Former  ?  Types: Snuff  ?  Quit date: 11/23/2013  ?Substance and Sexual Activity  ? Alcohol use: No  ?  Alcohol/week: 0.0 standard drinks  ? Drug use: No  ? Sexual activity: Not on file  ?Other Topics Concern  ? Not on file  ?Social History Narrative  ? Not on file  ? ?Social Determinants of Health  ? ?Financial Resource Strain: Low Risk   ? Difficulty of Paying Living Expenses: Not hard at all  ?Food Insecurity: No Food  Insecurity  ? Worried About Charity fundraiser in the Last Year: Never true  ? Ran Out of Food in the Last Year: Never true  ?Transportation Needs: No Transportation Needs  ? Lack of Transportation (Medical): No  ? Lack of Transportation (Non-Medical): No  ?Physical Activity: Inactive  ? Days of Exercise per Week: 0 days  ? Minutes of Exercise per Session: 0 min  ?Stress: No Stress Concern Present  ? Feeling of Stress : Not at all  ?Social Connections: Unknown  ? Frequency of Communication with Friends and Family: Patient refused  ? Frequency of Social Gatherings with Friends and Family: Patient refused  ? Attends Religious Services: Patient refused  ? Active Member of Clubs or Organizations: Patient refused  ? Attends Archivist Meetings: Patient refused  ? Marital Status: Patient refused  ? ? ?Tobacco Counseling ?Counseling given: Not Answered ? ? ?Clinical Intake: ? ?Pre-visit preparation completed: Yes ? ?Pain : No/denies pain ? ?  ? ?Nutritional Risks: None ?Diabetes: No ? ?How often do you need to have someone help you when you read instructions, pamphlets, or other written materials from your doctor or pharmacy?: 1 - Never ?What is the last grade level you completed in school?: elementary school ? ?Diabetic? no ? ?Interpreter Needed?: No ? ?Information entered by :: Lisette Abu, LPN ? ? ?Activities of Daily Living ? ?  03/02/2022  ?  3:17 PM  ?In your present state of health, do you have any difficulty performing the following activities:  ?Hearing? 0  ?Vision? 0  ?Difficulty concentrating or making decisions? 0  ?Walking or climbing stairs? 0  ?Dressing or bathing? 0  ?Doing errands, shopping? 1  ?Preparing Food and eating ? N  ?Using the Toilet? N  ?In the past six months, have you accidently leaked urine? Y  ?Do you have problems with loss of bowel control? Y  ?Managing your Medications? Y  ?Managing your Finances? Y  ?Housekeeping or managing your Housekeeping? Y  ? ? ?Patient Care  Team: ?Binnie Rail, MD as PCP - General (Internal Medicine) ?Trula Slade, DPM as Consulting Physician (Podiatry) ?Tanda Rockers, MD as Consulting Physician (Pulmonary Disease) ?Mauri Pole, MD as Consulting Physician (Gastroenterology) ?[name], [facility] as Pharmacist (Pharmacist) ? ?Indicate any recent Medical Services you may have received from other than [facility] providers in the past year (date may be approximate). ? ?   ?Assessment:  ? This is a routine wellness examination for Steven Matthews. ? ?Hearing/Vision screen ?Hearing Screening - Comments:: Patient denied any hearing difficulty.   ?No hearing aids. ? ?Vision Screening - Comments:: Patient does not wear any corrective lenses/contacts.   ? ?Dietary issues and exercise activities discussed: ?Current Exercise Habits: The patient does not participate in regular exercise at present, Exercise limited by: None identified ? ? Goals Addressed   ? ?  ?  ?  ?  ?  This Visit's Progress  ?  Patient declined health goal at this time.     ? ?  ?Depression Screen ? ?  03/02/2022  ?  3:09 PM 02/10/2021  ?  2:33 PM 02/06/2020  ?  2:32 PM 08/22/2018  ? 11:17 AM 08/16/2017  ?  9:36 AM 08/03/2016  ?  3:02 PM  ?PHQ 2/9 Scores  ?PHQ - 2 Score 0 0  0 0 0  ?PHQ- 9 Score    0    ?Exception Documentation   Other- indicate reason in comment box     ?Not completed   Dementia     ?  ?Fall Risk ? ?  03/02/2022  ?  3:17 PM 02/10/2021  ?  2:35 PM 02/06/2020  ?  2:32 PM 10/16/2019  ?  3:58 PM 08/22/2018  ? 11:17 AM  ?Fall Risk   ?Falls in the past year? 0 0 0 0 No  ?Comment    Emmi Telephone Survey: data to providers prior to load   ?Number falls in past yr: 0 0 0    ?Injury with Fall? 0 0 0    ?Risk for fall due to : No Fall Risks No Fall Risks     ?Follow up Falls evaluation completed Falls evaluation completed     ? ? ?FALL RISK PREVENTION PERTAINING TO THE HOME: ? ?Any stairs in or around the home? Yes  ?If so, are there any without handrails? No  ?Home free of loose throw rugs in  walkways, pet beds, electrical cords, etc? Yes  ?Adequate lighting in your home to reduce risk of falls? Yes  ? ?ASSISTIVE DEVICES UTILIZED TO PREVENT FALLS: ? ?Life alert? No  ?Use of a cane, walker or w/c? No  ?

## 2022-03-02 NOTE — Patient Instructions (Signed)
Mr. Gomillion , ?Thank you for taking time to come for your Medicare Wellness Visit. I appreciate your ongoing commitment to your health goals. Please review the following plan we discussed and let me know if I can assist you in the future.  ? ?Screening recommendations/referrals: ?Colonoscopy: Discontinued due to age ?Recommended yearly ophthalmology/optometry visit for glaucoma screening and checkup ?Recommended yearly dental visit for hygiene and checkup ? ?Vaccinations: ?Influenza vaccine: 08/11/2021 ?Pneumococcal vaccine: 06/09/2016 ?Tdap vaccine: declined/due ?Shingles vaccine: declined/due   ?Covid-19: 04/05/2020 ? ?Advanced directives: Yes ? ?Conditions/risks identified: Yes ? ?Next appointment: Please schedule your next Medicare Wellness Visit with your Nurse Health Advisor in 1 year by calling [phone]. ? ?Preventive Care 3 Years and Older, Male ?Preventive care refers to lifestyle choices and visits with your health care provider that can promote health and wellness. ?What does preventive care include? ?A yearly physical exam. This is also called an annual well check. ?Dental exams once or twice a year. ?Routine eye exams. Ask your health care provider how often you should have your eyes checked. ?Personal lifestyle choices, including: ?Daily care of your teeth and gums. ?Regular physical activity. ?Eating a healthy diet. ?Avoiding tobacco and drug use. ?Limiting alcohol use. ?Practicing safe sex. ?Taking low doses of aspirin every day. ?Taking vitamin and mineral supplements as recommended by your health care provider. ?What happens during an annual well check? ?The services and screenings done by your health care provider during your annual well check will depend on your age, overall health, lifestyle risk factors, and family history of disease. ?Counseling  ?Your health care provider may ask you questions about your: ?Alcohol use. ?Tobacco use. ?Drug use. ?Emotional well-being. ?Home and relationship  well-being. ?Sexual activity. ?Eating habits. ?History of falls. ?Memory and ability to understand (cognition). ?Work and work Statistician. ?Screening  ?You may have the following tests or measurements: ?Height, weight, and BMI. ?Blood pressure. ?Lipid and cholesterol levels. These may be checked every 5 years, or more frequently if you are over 68 years old. ?Skin check. ?Lung cancer screening. You may have this screening every year starting at age 24 if you have a 30-pack-year history of smoking and currently smoke or have quit within the past 15 years. ?Fecal occult blood test (FOBT) of the stool. You may have this test every year starting at age 74. ?Flexible sigmoidoscopy or colonoscopy. You may have a sigmoidoscopy every 5 years or a colonoscopy every 10 years starting at age 51. ?Prostate cancer screening. Recommendations will vary depending on your family history and other risks. ?Hepatitis C blood test. ?Hepatitis B blood test. ?Sexually transmitted disease (STD) testing. ?Diabetes screening. This is done by checking your blood sugar (glucose) after you have not eaten for a while (fasting). You may have this done every 1-3 years. ?Abdominal aortic aneurysm (AAA) screening. You may need this if you are a current or former smoker. ?Osteoporosis. You may be screened starting at age 27 if you are at high risk. ?Talk with your health care provider about your test results, treatment options, and if necessary, the need for more tests. ?Vaccines  ?Your health care provider may recommend certain vaccines, such as: ?Influenza vaccine. This is recommended every year. ?Tetanus, diphtheria, and acellular pertussis (Tdap, Td) vaccine. You may need a Td booster every 10 years. ?Zoster vaccine. You may need this after age 73. ?Pneumococcal 13-valent conjugate (PCV13) vaccine. One dose is recommended after age 60. ?Pneumococcal polysaccharide (PPSV23) vaccine. One dose is recommended after age 84. ?Talk to  your health care  provider about which screenings and vaccines you need and how often you need them. ?This information is not intended to replace advice given to you by your health care provider. Make sure you discuss any questions you have with your health care provider. ?Document Released: 12/06/2015 Document Revised: 07/29/2016 Document Reviewed: 09/10/2015 ?Elsevier Interactive Patient Education ? 2017 [place]. ? ?Fall Prevention in the Home ?Falls can cause injuries. They can happen to people of all ages. There are many things you can do to make your home safe and to help prevent falls. ?What can I do on the outside of my home? ?Regularly fix the edges of walkways and driveways and fix any cracks. ?Remove anything that might make you trip as you walk through a door, such as a raised step or threshold. ?Trim any bushes or trees on the path to your home. ?Use bright outdoor lighting. ?Clear any walking paths of anything that might make someone trip, such as rocks or tools. ?Regularly check to see if handrails are loose or broken. Make sure that both sides of any steps have handrails. ?Any raised decks and porches should have guardrails on the edges. ?Have any leaves, snow, or ice cleared regularly. ?Use sand or salt on walking paths during winter. ?Clean up any spills in your garage right away. This includes oil or grease spills. ?What can I do in the bathroom? ?Use night lights. ?Install grab bars by the toilet and in the tub and shower. Do not use towel bars as grab bars. ?Use non-skid mats or decals in the tub or shower. ?If you need to sit down in the shower, use a plastic, non-slip stool. ?Keep the floor dry. Clean up any water that spills on the floor as soon as it happens. ?Remove soap buildup in the tub or shower regularly. ?Attach bath mats securely with double-sided non-slip rug tape. ?Do not have throw rugs and other things on the floor that can make you trip. ?What can I do in the bedroom? ?Use night lights. ?Make  sure that you have a light by your bed that is easy to reach. ?Do not use any sheets or blankets that are too big for your bed. They should not hang down onto the floor. ?Have a firm chair that has side arms. You can use this for support while you get dressed. ?Do not have throw rugs and other things on the floor that can make you trip. ?What can I do in the kitchen? ?Clean up any spills right away. ?Avoid walking on wet floors. ?Keep items that you use a lot in easy-to-reach places. ?If you need to reach something above you, use a strong step stool that has a grab bar. ?Keep electrical cords out of the way. ?Do not use floor polish or wax that makes floors slippery. If you must use wax, use non-skid floor wax. ?Do not have throw rugs and other things on the floor that can make you trip. ?What can I do with my stairs? ?Do not leave any items on the stairs. ?Make sure that there are handrails on both sides of the stairs and use them. Fix handrails that are broken or loose. Make sure that handrails are as long as the stairways. ?Check any carpeting to make sure that it is firmly attached to the stairs. Fix any carpet that is loose or worn. ?Avoid having throw rugs at the top or bottom of the stairs. If you do have throw rugs, attach  them to the floor with carpet tape. ?Make sure that you have a light switch at the top of the stairs and the bottom of the stairs. If you do not have them, ask someone to add them for you. ?What else can I do to help prevent falls? ?Wear shoes that: ?Do not have high heels. ?Have rubber bottoms. ?Are comfortable and fit you well. ?Are closed at the toe. Do not wear sandals. ?If you use a stepladder: ?Make sure that it is fully opened. Do not climb a closed stepladder. ?Make sure that both sides of the stepladder are locked into place. ?Ask someone to hold it for you, if possible. ?Clearly mark and make sure that you can see: ?Any grab bars or handrails. ?First and last steps. ?Where the  edge of each step is. ?Use tools that help you move around (mobility aids) if they are needed. These include: ?Canes. ?Walkers. ?Scooters. ?Crutches. ?Turn on the lights when you go into a dark area. Repl

## 2022-03-17 ENCOUNTER — Telehealth: Payer: Medicare Other

## 2022-03-17 NOTE — Progress Notes (Deleted)
Chronic Care Management Pharmacy Note  03/17/2022 Name:  Steven Matthews MRN:  [id number] DOB:  May 06, 1931  Summary:  - Spoke with Velva Harman who is patient's niece and POA - reports that patient is doing well, denies any issues or concerns with current medications, BP well controlled in office, LDL, TSH, and anxiety are controlled   Recommendations/Changes made from today's visit: - Recommending no changes to current medications, niece is agreeable to monitor blood pressure once weekly and reach out should BP be elevated from goal - Discussed with niece about avoiding NSAIDS for aches/ pains, advised for use of tylenol for pain (up to 3g daily) and adequate BP control to help prevent worsening kidney function   Subjective: Steven Matthews is an 88 y.o. year old male who is a primary patient of Burns, Claudina Lick, MD.  The CCM team was consulted for assistance with disease management and care coordination needs.    Engaged with patient by telephone for follow up visit in response to provider referral for pharmacy case management and/or care coordination services.   Consent to Services:  The patient was given the following information about Chronic Care Management services today, agreed to services, and gave verbal consent: 1. CCM service includes personalized support from designated clinical staff supervised by the primary care provider, including individualized plan of care and coordination with other care providers 2. 24/7 contact phone numbers for assistance for urgent and routine care needs. 3. Service will only be billed when office clinical staff spend 20 minutes or more in a month to coordinate care. 4. Only one practitioner may furnish and [place] the service in a calendar month. 5.The patient may stop CCM services at any time (effective at the end of the month) by phone call to the office staff. 6. The patient will be responsible for cost sharing (co-pay) of up to 20% of the service fee (after annual  deductible is met). Patient agreed to services and consent obtained.  Patient Care Team: Binnie Rail, MD as PCP - General (Internal Medicine) Trula Slade, DPM as Consulting Physician (Podiatry) Tanda Rockers, MD as Consulting Physician (Pulmonary Disease) Mauri Pole, MD as Consulting Physician (Gastroenterology) [name], [facility] as Pharmacist (Pharmacist)  Recent office visits:  02/09/2022 - Dr. Quay Burow - no changes to medications - f/u in 6 months    Recent consult visits:  02/16/2022 - Dr. Elisha Ponder - Podiatry - no new findings - follow up in 3 months  11/10/2021 - Dr. Elisha Ponder - Podiatry - no changes to medications - f/u in 3 months   Hospital visits:  None in previous 6 months  Objective:  Lab Results  Component Value Date   CREATININE 1.58 (H) 02/09/2022   BUN 25 (H) 02/09/2022   GFR 38.67 (L) 02/09/2022   GFRNONAA 31 (L) 08/12/2020   GFRAA 36 (L) 08/12/2020   NA 140 02/09/2022   K 3.9 02/09/2022   CALCIUM 9.9 02/09/2022   CO2 27 02/09/2022   GLUCOSE 93 02/09/2022    Lab Results  Component Value Date/Time   HGBA1C 6.3 02/09/2022 02:59 PM   HGBA1C 6.4 08/11/2021 03:41 PM   GFR 38.67 (L) 02/09/2022 02:59 PM   GFR 38.81 (L) 08/11/2021 03:41 PM    Last diabetic Eye exam:  No results found for: HMDIABEYEEXA  Last diabetic Foot exam:  No results found for: HMDIABFOOTEX   Lab Results  Component Value Date   CHOL 170 02/09/2022   HDL 60.00 02/09/2022   [place]  89 02/09/2022   TRIG 101.0 02/09/2022   CHOLHDL 3 02/09/2022       Latest Ref Rng & Units 02/09/2022    2:59 PM 08/11/2021    3:41 PM 02/10/2021    3:20 PM  Hepatic Function  Total Protein 6.0 - 8.3 g/dL 8.1   8.4   8.2    Albumin 3.5 - 5.2 g/dL 4.3   4.3   4.4    AST 0 - 37 U/L _0 ALT 0 - 53 U/L _1 Alk Phosphatase 39 - 117 U/L 63   57   66    Total Bilirubin 0.2 - 1.2 mg/dL 0.7   0.8   0.9      Lab Results  Component Value Date/Time   TSH 2.50  02/09/2022 02:59 PM   TSH 5.57 (H) 08/11/2021 03:41 PM       Latest Ref Rng & Units 02/09/2022    2:59 PM 08/12/2020    2:08 PM 02/13/2020    3:57 PM  CBC  WBC 4.0 - 10.5 K/uL 7.5   7.2   6.4    Hemoglobin 13.0 - 17.0 g/dL 13.6   13.9   12.1    Hematocrit 39.0 - 52.0 % 40.9   42.5   36.5    Platelets 150.0 - 400.0 K/uL 205.0   188   372.0      No results found for: VD25OH  Clinical ASCVD: No  The ASCVD Risk score (Arnett DK, et al., 2019) failed to calculate for the following reasons:   The 2019 ASCVD risk score is only valid for ages 62 to 43       03/02/2022    3:09 PM 02/10/2021    2:33 PM 08/22/2018   11:17 AM  Depression screen PHQ 2/9  Decreased Interest 0 0 0  Down, Depressed, Hopeless 0 0 0  PHQ - 2 Score 0 0 0  Altered sleeping   0  Tired, decreased energy   0  Change in appetite   0  Feeling bad or failure about yourself    0  Trouble concentrating   0  Moving slowly or fidgety/restless   0  Suicidal thoughts   0  PHQ-9 Score   0    Social History   Tobacco Use  Smoking Status Never  Smokeless Tobacco Former   Types: Snuff   Quit date: 11/23/2013   BP Readings from Last 3 Encounters:  02/09/22 132/80  08/11/21 (!) 144/76  02/10/21 130/78   Pulse Readings from Last 3 Encounters:  02/09/22 78  08/11/21 79  02/10/21 78   Wt Readings from Last 3 Encounters:  02/09/22 154 lb 6.4 oz (70 kg)  08/11/21 165 lb (74.8 kg)  02/10/21 163 lb 3.2 oz (74 kg)   BMI Readings from Last 3 Encounters:  02/09/22 25.69 kg/m  08/11/21 27.46 kg/m  02/10/21 27.16 kg/m    Assessment/Interventions: Review of patient past medical history, allergies, medications, health status, including review of consultants reports, laboratory and other test data, was performed as part of comprehensive evaluation and provision of chronic care management services.   SDOH:  (Social Determinants of Health) assessments and interventions performed: Yes  SDOH Screenings   Alcohol Screen:  Low Risk    Last Alcohol Screening Score (AUDIT): 0  Depression (PHQ2-9): Low Risk    PHQ-2 Score: 0  Financial Resource Strain: Low  Risk    Difficulty of Paying Living Expenses: Not hard at all  Food Insecurity: No Food Insecurity   Worried About Charity fundraiser in the Last Year: Never true   Ran Out of Food in the Last Year: Never true  Housing: [place] Risk Score: 0  Physical Activity: Inactive   Days of Exercise per Week: 0 days   Minutes of Exercise per Session: 0 min  Social Connections: Unknown   Frequency of Communication with Friends and Family: Patient refused   Frequency of Social Gatherings with Friends and Family: Patient refused   Attends Religious Services: Patient refused   Marine scientist or Organizations: Patient refused   Attends Music therapist: Patient refused   Marital Status: Patient refused  Stress: No Stress Concern Present   Feeling of Stress : Not at all  Tobacco Use: Medium Risk   Smoking Tobacco Use: Never   Smokeless Tobacco Use: Former   Passive Exposure: Not on Pensions consultant Needs: No Transportation Needs   Lack of Transportation (Medical): No   Lack of Transportation (Non-Medical): No    CCM Care Plan  No Known Allergies  Medications Reviewed Today     Reviewed by Sheral Flow, LPN (Licensed Practical Nurse) on 03/02/22 at 48  Med List Status: <None>   Medication Order Taking? Sig Documenting Provider Last Dose Status Informant  ALPRAZolam (XANAX) 0.25 MG tablet [id number] Yes TAKE 1 TABLET(0.25 MG) BY MOUTH EVERY 6 HOURS AS NEEDED Burns, Claudina Lick, MD Taking Active   amLODipine (NORVASC) 10 MG tablet [id number] Yes TAKE 1 TABLET(10 MG) BY MOUTH DAILY Burns, Claudina Lick, MD Taking Active   levothyroxine (SYNTHROID) 88 MCG tablet [id number] Yes Take 1 tablet (88 mcg total) by mouth daily. Binnie Rail, MD Taking Active   pantoprazole (PROTONIX) 40 MG tablet [id number] Yes TAKE 1 TABLET BY  MOUTH TWICE DAILY 30 MINUTES BEFORE BREAKFAST AND DINNER Binnie Rail, MD Taking Active   pravastatin (PRAVACHOL) 80 MG tablet [id number] Yes TAKE 1 TABLET(80 MG) BY MOUTH AT BEDTIME Binnie Rail, MD Taking Active   tamsulosin (FLOMAX) 0.4 MG CAPS capsule [id number] Yes TAKE 1 CAPSULE(0.4 MG) BY MOUTH DAILY AFTER SUPPER Binnie Rail, MD Taking Active             Patient Active Problem List   Diagnosis Date Noted   Weight loss 02/09/2022   CKD (chronic kidney disease) stage 3, GFR 30-59 ml/min ([place]) 02/08/2022   Left wrist pain 08/11/2021   BPH with obstruction/lower urinary tract symptoms 08/11/2020   Anemia 08/11/2020   Elevated d-dimer 02/06/2020   Prediabetes 08/19/2017   GERD (gastroesophageal reflux disease) 02/15/2017   Essential hypertension, benign 08/03/2016   Hyperlipidemia 08/03/2016   Hypothyroidism 08/03/2016   History of prostate cancer 08/03/2016   Intellectual disability 08/03/2016   Anxiety 08/03/2016   Nail hypertrophy 08/03/2016   Chronic respiratory failure with hypoxia ([place]) 04/23/2016   OSA (obstructive sleep apnea) 04/23/2016   Upper airway cough syndrome - chronic 03/25/2016    Immunization History  Administered Date(s) Administered   Fluad Quad(high Dose 65+) 02/10/2021, 08/11/2021   Influenza, High Dose Seasonal PF 08/03/2016, 08/16/2017, 08/22/2018   Influenza,inj,Quad PF,6+ Mos 10/18/2015   Janssen (J&J) SARS-COV-2 Vaccination 04/05/2020   Pneumococcal Polysaccharide-23 06/09/2006    Conditions to be addressed/monitored:  Hypertension, Hyperlipidemia, GERD, Chronic Kidney Disease, Hypothyroidism, Anxiety, and BPH  There are no care plans that you recently modified  to display for this patient.      Medication Assistance: None required.  Patient affirms current coverage meets needs.  Patient's preferred pharmacy is:  [id number] [address] [place] [address] Phone: [phone] Fax: [phone]    Uses pill box? Yes Pt endorses 100% compliance  Care Plan and Follow Up Patient Decision:  Patient agrees to Care Plan and Follow-up.  Plan: Telephone follow up appointment with care management team member scheduled for:  6 months and The patient has been provided with contact information for the care management team and has been advised to call with any health related questions or concerns.   [name], PharmD Clinical Pharmacist, [place]

## 2022-05-19 ENCOUNTER — Encounter: Payer: Self-pay | Admitting: Podiatry

## 2022-05-19 ENCOUNTER — Ambulatory Visit (INDEPENDENT_AMBULATORY_CARE_PROVIDER_SITE_OTHER): Payer: Medicare Other | Admitting: Podiatry

## 2022-05-19 DIAGNOSIS — B351 Tinea unguium: Secondary | ICD-10-CM | POA: Diagnosis not present

## 2022-05-19 DIAGNOSIS — M79675 Pain in left toe(s): Secondary | ICD-10-CM

## 2022-05-19 DIAGNOSIS — M79674 Pain in right toe(s): Secondary | ICD-10-CM | POA: Diagnosis not present

## 2022-05-26 NOTE — Progress Notes (Signed)
  Subjective:  Patient ID: Steven Matthews, male    DOB: Dec 01, 1930,  MRN: [id number]  Steven Matthews presents to clinic today for painful thick toenails that are difficult to trim. Pain interferes with ambulation. Aggravating factors include wearing enclosed shoe gear. Pain is relieved with periodic professional debridement.  His niece is present during today's visit.  New problem(s): None.   PCP is Binnie Rail, MD , and last visit was February 09, 2022.  No Known Allergies  Review of Systems: Negative except as noted in the HPI.  Objective: No changes noted in today's physical examination.  Vascular Examination: CFT immediate b/l LE. Faintly palpable DP/PT pulses b/l LE. Digital hair sparse b/l. Skin temperature gradient WNL b/l. No pain with calf compression b/l. No edema noted b/l. No cyanosis or clubbing noted b/l LE.  Neurological Examination: Sensation grossly intact b/l with 10 gram monofilament. Vibratory sensation intact b/l.   Dermatological Examination: Pedal skin with normal turgor, texture and tone b/l. Toenails 1-5 b/l thick, discolored, elongated with subungual debris and pain on dorsal palpation. No hyperkeratotic lesions noted b/l.   Musculoskeletal Examination: Muscle strength 5/5 to b/l LE. No pain, crepitus or joint limitation noted with ROM bilateral LE. No gross bony deformities bilaterally.  Radiographs: None    Latest Ref Rng & Units 02/09/2022    2:59 PM 08/11/2021    3:41 PM  Hemoglobin A1C  Hemoglobin-A1c 4.6 - 6.5 % 6.3  6.4    Assessment/Plan: 1. Pain due to onychomycosis of toenails of both feet     -Examined patient. -Patient to continue soft, supportive shoe gear daily. -Mycotic toenails 1-5 bilaterally were debrided in length and girth with sterile nail nippers and dremel without incident. -Patient/POA to call should there be question/concern in the interim.   Return in about 3 months (around 08/19/2022).  Marzetta Board, DPM

## 2022-06-05 ENCOUNTER — Other Ambulatory Visit: Payer: Self-pay | Admitting: Internal Medicine

## 2022-06-13 ENCOUNTER — Other Ambulatory Visit: Payer: Self-pay | Admitting: Internal Medicine

## 2022-07-11 ENCOUNTER — Other Ambulatory Visit: Payer: Self-pay | Admitting: Internal Medicine

## 2022-07-28 ENCOUNTER — Encounter: Payer: Self-pay | Admitting: Internal Medicine

## 2022-08-07 ENCOUNTER — Other Ambulatory Visit: Payer: Self-pay | Admitting: Internal Medicine

## 2022-08-16 ENCOUNTER — Encounter: Payer: Self-pay | Admitting: Internal Medicine

## 2022-08-16 NOTE — Patient Instructions (Addendum)
     Flu immunization administered today.     Blood work was ordered.     Medications changes include :   None    Return in about 6 months (around 02/15/2023) for follow up.

## 2022-08-16 NOTE — Progress Notes (Signed)
Subjective:    Patient ID: Steven Matthews, male    DOB: [birth year], 88 y.o.   MRN: [id number]     HPI Steven Matthews is here for follow up of his chronic medical problems, including htn, CKD, GERD, hypothyroid, hld, anxiety, prediabetes, BPH.  He is here with his niece who provides most of the history.  No concerns.  He is taking his medication as prescribed.   He had covid the beginning of the month - doing well now.  His chronic cough is a little worse.    Difficulty walking long distances - would like handicap.   Medications and allergies reviewed with patient and updated if appropriate.  Current Outpatient Medications on File Prior to Visit  Medication Sig Dispense Refill   ALPRAZolam (XANAX) 0.25 MG tablet TAKE 1 TABLET(0.25 MG) BY MOUTH EVERY 6 HOURS AS NEEDED 60 tablet 0   amLODipine (NORVASC) 10 MG tablet TAKE 1 TABLET(10 MG) BY MOUTH DAILY 90 tablet 2   levothyroxine (SYNTHROID) 88 MCG tablet Take 1 tablet (88 mcg total) by mouth daily. 90 tablet 3   pantoprazole (PROTONIX) 40 MG tablet TAKE 1 TABLET BY MOUTH TWICE DAILY 30 MINUTES BEFORE BREAKFAST AND DINNER 180 tablet 1   pravastatin (PRAVACHOL) 80 MG tablet TAKE 1 TABLET(80 MG) BY MOUTH AT BEDTIME 90 tablet 1   tamsulosin (FLOMAX) 0.4 MG CAPS capsule TAKE 1 CAPSULE(0.4 MG) BY MOUTH DAILY AFTER SUPPER 90 capsule 2   No current facility-administered medications on file prior to visit.     Review of Systems  Constitutional:  Negative for chills and fever.  Respiratory:  Positive for cough (chronic - worse since covid) and shortness of breath (with exertion). Negative for wheezing.   Cardiovascular:  Negative for chest pain, palpitations and leg swelling.  Gastrointestinal:  Negative for abdominal pain.  Genitourinary:  Negative for difficulty urinating.  Neurological:  Negative for light-headedness and headaches.       Objective:   Vitals:   08/17/22 1344  BP: 136/72  Pulse: 90  Temp: 98.5 F (36.9 C)  SpO2: 98%    BP Readings from Last 3 Encounters:  08/17/22 136/72  02/09/22 132/80  08/11/21 (!) 144/76   Wt Readings from Last 3 Encounters:  08/17/22 147 lb (66.7 kg)  02/09/22 154 lb 6.4 oz (70 kg)  08/11/21 165 lb (74.8 kg)   Body mass index is 24.46 kg/m.    Physical Exam Constitutional:      General: He is not in acute distress.    Appearance: Normal appearance. He is not ill-appearing.  HENT:     Head: Normocephalic and atraumatic.  Eyes:     Conjunctiva/sclera: Conjunctivae normal.  Cardiovascular:     Rate and Rhythm: Normal rate and regular rhythm.     Heart sounds: Normal heart sounds. No murmur heard. Pulmonary:     Effort: Pulmonary effort is normal. No respiratory distress.     Breath sounds: Normal breath sounds. No wheezing or rales.  Musculoskeletal:     Right lower leg: No edema.     Left lower leg: No edema.  Skin:    General: Skin is warm and dry.     Findings: No rash.  Neurological:     Mental Status: He is alert. Mental status is at baseline.  Psychiatric:        Mood and Affect: Mood normal.        Lab Results  Component Value Date   WBC 7.5 02/09/2022  HGB 13.6 02/09/2022   HCT 40.9 02/09/2022   PLT 205.0 02/09/2022   GLUCOSE 93 02/09/2022   CHOL 170 02/09/2022   TRIG 101.0 02/09/2022   HDL 60.00 02/09/2022   LDLCALC 89 02/09/2022   ALT 14 02/09/2022   AST 21 02/09/2022   NA 140 02/09/2022   K 3.9 02/09/2022   CL 103 02/09/2022   CREATININE 1.58 (H) 02/09/2022   BUN 25 (H) 02/09/2022   CO2 27 02/09/2022   TSH 2.50 02/09/2022   HGBA1C 6.3 02/09/2022     Assessment & Plan:    See Problem List for Assessment and Plan of chronic medical problems.

## 2022-08-17 ENCOUNTER — Ambulatory Visit (INDEPENDENT_AMBULATORY_CARE_PROVIDER_SITE_OTHER): Payer: Medicare Other | Admitting: Internal Medicine

## 2022-08-17 ENCOUNTER — Encounter: Payer: Self-pay | Admitting: Internal Medicine

## 2022-08-17 VITALS — BP 136/72 | HR 90 | Temp 98.5°F | Ht 65.0 in | Wt 147.0 lb

## 2022-08-17 DIAGNOSIS — N1832 Chronic kidney disease, stage 3b: Secondary | ICD-10-CM | POA: Diagnosis not present

## 2022-08-17 DIAGNOSIS — E7849 Other hyperlipidemia: Secondary | ICD-10-CM

## 2022-08-17 DIAGNOSIS — F419 Anxiety disorder, unspecified: Secondary | ICD-10-CM

## 2022-08-17 DIAGNOSIS — R7303 Prediabetes: Secondary | ICD-10-CM | POA: Diagnosis not present

## 2022-08-17 DIAGNOSIS — I10 Essential (primary) hypertension: Secondary | ICD-10-CM | POA: Diagnosis not present

## 2022-08-17 DIAGNOSIS — K219 Gastro-esophageal reflux disease without esophagitis: Secondary | ICD-10-CM | POA: Diagnosis not present

## 2022-08-17 DIAGNOSIS — E039 Hypothyroidism, unspecified: Secondary | ICD-10-CM | POA: Diagnosis not present

## 2022-08-17 DIAGNOSIS — R058 Other specified cough: Secondary | ICD-10-CM | POA: Diagnosis not present

## 2022-08-17 DIAGNOSIS — Z23 Encounter for immunization: Secondary | ICD-10-CM

## 2022-08-17 DIAGNOSIS — N138 Other obstructive and reflux uropathy: Secondary | ICD-10-CM

## 2022-08-17 DIAGNOSIS — N401 Enlarged prostate with lower urinary tract symptoms: Secondary | ICD-10-CM

## 2022-08-17 LAB — CBC WITH DIFFERENTIAL/PLATELET
Basophils Absolute: 0.1 10*3/uL (ref 0.0–0.1)
Basophils Relative: 1.1 % (ref 0.0–3.0)
Eosinophils Absolute: 0.4 10*3/uL (ref 0.0–0.7)
Eosinophils Relative: 7 % — ABNORMAL HIGH (ref 0.0–5.0)
HCT: 36 % — ABNORMAL LOW (ref 39.0–52.0)
Hemoglobin: 11.7 g/dL — ABNORMAL LOW (ref 13.0–17.0)
Lymphocytes Relative: 29.1 % (ref 12.0–46.0)
Lymphs Abs: 1.6 10*3/uL (ref 0.7–4.0)
MCHC: 32.5 g/dL (ref 30.0–36.0)
MCV: 85.9 fl (ref 78.0–100.0)
Monocytes Absolute: 0.6 10*3/uL (ref 0.1–1.0)
Monocytes Relative: 9.9 % (ref 3.0–12.0)
Neutro Abs: 3 10*3/uL (ref 1.4–7.7)
Neutrophils Relative %: 52.9 % (ref 43.0–77.0)
Platelets: 229 10*3/uL (ref 150.0–400.0)
RBC: 4.2 Mil/uL — ABNORMAL LOW (ref 4.22–5.81)
RDW: 16.3 % — ABNORMAL HIGH (ref 11.5–15.5)
WBC: 5.7 10*3/uL (ref 4.0–10.5)

## 2022-08-17 LAB — COMPREHENSIVE METABOLIC PANEL
ALT: 10 U/L (ref 0–53)
AST: 19 U/L (ref 0–37)
Albumin: 3.8 g/dL (ref 3.5–5.2)
Alkaline Phosphatase: 58 U/L (ref 39–117)
BUN: 18 mg/dL (ref 6–23)
CO2: 26 mEq/L (ref 19–32)
Calcium: 9.4 mg/dL (ref 8.4–10.5)
Chloride: 102 mEq/L (ref 96–112)
Creatinine, Ser: 1.52 mg/dL — ABNORMAL HIGH (ref 0.40–1.50)
GFR: 40.36 mL/min — ABNORMAL LOW (ref >60.00)
Glucose, Bld: 113 mg/dL — ABNORMAL HIGH (ref 70–99)
Potassium: 4.1 mEq/L (ref 3.5–5.1)
Sodium: 138 mEq/L (ref 135–145)
Total Bilirubin: 0.6 mg/dL (ref 0.2–1.2)
Total Protein: 7.9 g/dL (ref 6.0–8.3)

## 2022-08-17 LAB — LIPID PANEL
Cholesterol: 163 mg/dL (ref 0–200)
HDL: 60.9 mg/dL (ref >39.00)
LDL Cholesterol: 84 mg/dL (ref 0–99)
NonHDL: 102.14
Total CHOL/HDL Ratio: 3
Triglycerides: 90 mg/dL (ref 0.0–149.0)
VLDL: 18 mg/dL (ref 0.0–40.0)

## 2022-08-17 LAB — TSH: TSH: 1.79 u[IU]/mL (ref 0.35–5.50)

## 2022-08-17 MED ORDER — ALPRAZOLAM 0.25 MG PO TABS
ORAL_TABLET | ORAL | 5 refills | Status: DC
Start: 2022-08-17 — End: 2023-04-14

## 2022-08-17 NOTE — Assessment & Plan Note (Signed)
Chronic Slightly worse since having had covid earlier this month Continue otc symptomatic treatment

## 2022-08-17 NOTE — Assessment & Plan Note (Signed)
Chronic GERD controlled Continue pantoprazole 40 mg daily 

## 2022-08-17 NOTE — Assessment & Plan Note (Addendum)
Chronic Controlled Continue Flomax 0.4 mg daily Following with urology

## 2022-08-17 NOTE — Assessment & Plan Note (Signed)
Chronic Regular exercise and healthy diet encouraged Check lipid panel  Continue pravastatin 80 mg daily

## 2022-08-17 NOTE — Assessment & Plan Note (Signed)
Chronic Has been stable CMP, CBC

## 2022-08-17 NOTE — Assessment & Plan Note (Signed)
Chronic BP well controlled Continue amlodipine 10 mg daily cmp

## 2022-08-17 NOTE — Assessment & Plan Note (Signed)
Chronic Controlled, Stable Continue alprazolam 0.25 mg every 6 hours as needed

## 2022-08-17 NOTE — Assessment & Plan Note (Signed)
Chronic  Clinically euthyroid Check tsh and will titrate med dose if needed Currently taking levothyroxine 88 mcg daily 

## 2022-08-17 NOTE — Addendum Note (Signed)
Addended by: Marcina Millard on: 08/17/2022 02:21 PM   Modules accepted: Orders

## 2022-08-18 DIAGNOSIS — R350 Frequency of micturition: Secondary | ICD-10-CM | POA: Diagnosis not present

## 2022-08-18 LAB — HEMOGLOBIN A1C: Hgb A1c MFr Bld: 6.5 % (ref 4.6–6.5)

## 2022-08-25 ENCOUNTER — Ambulatory Visit (INDEPENDENT_AMBULATORY_CARE_PROVIDER_SITE_OTHER): Payer: Medicare Other | Admitting: Podiatry

## 2022-08-25 ENCOUNTER — Encounter: Payer: Self-pay | Admitting: Podiatry

## 2022-08-25 DIAGNOSIS — M79675 Pain in left toe(s): Secondary | ICD-10-CM | POA: Diagnosis not present

## 2022-08-25 DIAGNOSIS — M79674 Pain in right toe(s): Secondary | ICD-10-CM

## 2022-08-25 DIAGNOSIS — B351 Tinea unguium: Secondary | ICD-10-CM

## 2022-08-30 NOTE — Progress Notes (Signed)
  Subjective:  Patient ID: Steven Matthews, male    DOB: [birth year],  MRN: [id number]  Steven Matthews presents to clinic today for:  Chief Complaint  Patient presents with   Callouses    [place] problem(s): None.   He is accompanied by his niece on today's visit.  PCP is Binnie Rail, MD , and last visit was  August 17, 2022.  No Known Allergies  Review of Systems: Negative except as noted in the HPI.  Objective: No changes noted in today's physical examination.  Steven Matthews is a pleasant 88 y.o. male WD, WN in NAD. AAO x 3.  Vascular Examination: CFT immediate b/l LE. Faintly palpable DP/PT pulses b/l LE. Digital hair sparse b/l. Skin temperature gradient WNL b/l. No pain with calf compression b/l. No edema noted b/l. No cyanosis or clubbing noted b/l LE.  Neurological Examination: Sensation grossly intact b/l with 10 gram monofilament. Vibratory sensation intact b/l.   Dermatological Examination: Pedal skin with normal turgor, texture and tone b/l. Toenails 1-5 b/l thick, discolored, elongated with subungual debris and pain on dorsal palpation. No hyperkeratotic lesions noted b/l.   Musculoskeletal Examination: Muscle strength 5/5 to b/l LE. No pain, crepitus or joint limitation noted with ROM bilateral LE. No gross bony deformities bilaterally.  Radiographs: None  Assessment/Plan: 1. Pain due to onychomycosis of toenails of both feet     No orders of the defined types were placed in this encounter.   -Patient was evaluated and treated. All patient's and/or POA's questions/concerns answered on today's visit. -No new findings. No new orders. -Patient to continue soft, supportive shoe gear daily. -Toenails 1-5 b/l were debrided in length and girth with sterile nail nippers and dremel without iatrogenic bleeding.  -Patient/POA to call should there be question/concern in the interim.   Return in about 3 months (around 11/25/2022).  Marzetta Board, DPM

## 2022-09-11 ENCOUNTER — Other Ambulatory Visit: Payer: Self-pay | Admitting: Internal Medicine

## 2022-11-03 ENCOUNTER — Encounter: Payer: Self-pay | Admitting: Internal Medicine

## 2022-11-04 ENCOUNTER — Other Ambulatory Visit: Payer: Self-pay

## 2022-11-04 MED ORDER — LEVOTHYROXINE SODIUM 88 MCG PO TABS: 88.0000 ug | ORAL_TABLET | Freq: Every day | ORAL | 3 refills | Status: DC

## 2022-12-28 ENCOUNTER — Encounter: Payer: Self-pay | Admitting: Podiatry

## 2022-12-28 ENCOUNTER — Ambulatory Visit (INDEPENDENT_AMBULATORY_CARE_PROVIDER_SITE_OTHER): Payer: 59 | Admitting: Podiatry

## 2022-12-28 VITALS — BP 141/77

## 2022-12-28 DIAGNOSIS — B351 Tinea unguium: Secondary | ICD-10-CM

## 2022-12-28 DIAGNOSIS — M79675 Pain in left toe(s): Secondary | ICD-10-CM

## 2022-12-28 DIAGNOSIS — M79674 Pain in right toe(s): Secondary | ICD-10-CM

## 2022-12-28 NOTE — Progress Notes (Unsigned)
  Subjective:  Patient ID: Steven Matthews, male    DOB: 08/21/1931,  MRN: [id number]  Steven Matthews presents to clinic today for painful elongated mycotic toenails 1-5 bilaterally which are tender when wearing enclosed shoe gear. Pain is relieved with periodic professional debridement.  Chief Complaint  Patient presents with   Nail Problem    RFC Matthews, Steven PCP VST-07/2022   New problem(s): None.   PCP is Steven Rail, MD.  No Known Allergies  Review of Systems: Negative except as noted in the HPI.  Objective: No changes noted in today's physical examination. There were no vitals filed for this visit.  Steven Matthews is a pleasant 89 y.o. male WD, WN in NAD. AAO x 3.  .Vascular Examination: CFT immediate b/l LE. Faintly palpable DP/PT pulses b/l LE. Digital hair sparse b/l. Skin temperature gradient WNL b/l. No pain with calf compression b/l. No edema noted b/l. No cyanosis or clubbing noted b/l LE.  Neurological Examination: Sensation grossly intact b/l with 10 gram monofilament. Vibratory sensation intact b/l.   Dermatological Examination: Pedal skin with normal turgor, texture and tone b/l. Toenails 1-5 b/l thick, discolored, elongated with subungual debris and pain on dorsal palpation. No hyperkeratotic lesions noted b/l.   Musculoskeletal Examination: Muscle strength 5/5 to b/l LE. No pain, crepitus or joint limitation noted with ROM bilateral LE. No gross bony deformities bilaterally. Patient ambulates independent of any assistive aids.  Radiographs: None  Assessment/Plan: 1. Pain due to onychomycosis of toenails of both feet     -Patient's family member present. All questions/concerns addressed on today's visit. -Examined patient. -Patient to continue soft, supportive shoe gear daily. -Mycotic toenails 1-5 bilaterally were debrided in length and girth with sterile nail nippers and dremel without incident. -Patient/POA to call should there be question/concern in the  interim.   Return in about 3 months (around 03/28/2023).  Marzetta Board, DPM

## 2023-02-14 ENCOUNTER — Encounter: Payer: Self-pay | Admitting: Internal Medicine

## 2023-02-14 NOTE — Patient Instructions (Addendum)
      Blood work was ordered.   The lab is on the first floor.    Medications changes include :   none     Return in about 6 months (around 08/18/2023) for follow up.

## 2023-02-15 ENCOUNTER — Encounter: Payer: Self-pay | Admitting: Internal Medicine

## 2023-02-15 ENCOUNTER — Ambulatory Visit (INDEPENDENT_AMBULATORY_CARE_PROVIDER_SITE_OTHER): Payer: 59 | Admitting: Internal Medicine

## 2023-02-15 VITALS — BP 138/82 | HR 85 | Temp 97.9°F | Ht 65.0 in | Wt 148.0 lb

## 2023-02-15 DIAGNOSIS — E7849 Other hyperlipidemia: Secondary | ICD-10-CM

## 2023-02-15 DIAGNOSIS — N1832 Chronic kidney disease, stage 3b: Secondary | ICD-10-CM

## 2023-02-15 DIAGNOSIS — R7303 Prediabetes: Secondary | ICD-10-CM | POA: Diagnosis not present

## 2023-02-15 DIAGNOSIS — I10 Essential (primary) hypertension: Secondary | ICD-10-CM | POA: Diagnosis not present

## 2023-02-15 DIAGNOSIS — E039 Hypothyroidism, unspecified: Secondary | ICD-10-CM

## 2023-02-15 LAB — COMPREHENSIVE METABOLIC PANEL
ALT: 11 U/L (ref 0–53)
AST: 17 U/L (ref 0–37)
Albumin: 4.2 g/dL (ref 3.5–5.2)
Alkaline Phosphatase: 67 U/L (ref 39–117)
BUN: 27 mg/dL — ABNORMAL HIGH (ref 6–23)
CO2: 26 mEq/L (ref 19–32)
Calcium: 9.7 mg/dL (ref 8.4–10.5)
Chloride: 106 mEq/L (ref 96–112)
Creatinine, Ser: 1.58 mg/dL — ABNORMAL HIGH (ref 0.40–1.50)
GFR: 38.4 mL/min — ABNORMAL LOW (ref >60.00)
Glucose, Bld: 115 mg/dL — ABNORMAL HIGH (ref 70–99)
Potassium: 4.8 mEq/L (ref 3.5–5.1)
Sodium: 141 mEq/L (ref 135–145)
Total Bilirubin: 0.6 mg/dL (ref 0.2–1.2)
Total Protein: 8.1 g/dL (ref 6.0–8.3)

## 2023-02-15 LAB — CBC WITH DIFFERENTIAL/PLATELET
Basophils Absolute: 0 10*3/uL (ref 0.0–0.1)
Basophils Relative: 0.5 % (ref 0.0–3.0)
Eosinophils Absolute: 0.1 10*3/uL (ref 0.0–0.7)
Eosinophils Relative: 1.9 % (ref 0.0–5.0)
HCT: 39.6 % (ref 39.0–52.0)
Hemoglobin: 13.2 g/dL (ref 13.0–17.0)
Lymphocytes Relative: 26 % (ref 12.0–46.0)
Lymphs Abs: 1.7 10*3/uL (ref 0.7–4.0)
MCHC: 33.2 g/dL (ref 30.0–36.0)
MCV: 86.4 fl (ref 78.0–100.0)
Monocytes Absolute: 0.6 10*3/uL (ref 0.1–1.0)
Monocytes Relative: 9.1 % (ref 3.0–12.0)
Neutro Abs: 4.1 10*3/uL (ref 1.4–7.7)
Neutrophils Relative %: 62.5 % (ref 43.0–77.0)
Platelets: 248 10*3/uL (ref 150.0–400.0)
RBC: 4.59 Mil/uL (ref 4.22–5.81)
RDW: 15.8 % — ABNORMAL HIGH (ref 11.5–15.5)
WBC: 6.5 10*3/uL (ref 4.0–10.5)

## 2023-02-15 LAB — LIPID PANEL
Cholesterol: 172 mg/dL (ref 0–200)
HDL: 70.3 mg/dL (ref >39.00)
LDL Cholesterol: 86 mg/dL (ref 0–99)
NonHDL: 101.97
Total CHOL/HDL Ratio: 2
Triglycerides: 82 mg/dL (ref 0.0–149.0)
VLDL: 16.4 mg/dL (ref 0.0–40.0)

## 2023-02-15 LAB — HEMOGLOBIN A1C: Hgb A1c MFr Bld: 6.5 % (ref 4.6–6.5)

## 2023-02-15 MED ORDER — TAMSULOSIN HCL 0.4 MG PO CAPS: ORAL_CAPSULE | ORAL | 2 refills | Status: DC

## 2023-02-15 MED ORDER — AMLODIPINE BESYLATE 10 MG PO TABS: ORAL_TABLET | ORAL | 2 refills | Status: DC

## 2023-02-15 MED ORDER — PANTOPRAZOLE SODIUM 40 MG PO TBEC: DELAYED_RELEASE_TABLET | ORAL | 1 refills | Status: DC

## 2023-02-15 MED ORDER — PRAVASTATIN SODIUM 80 MG PO TABS: ORAL_TABLET | ORAL | 1 refills | Status: DC

## 2023-02-15 NOTE — Assessment & Plan Note (Signed)
Chronic Check a1c Low sugar / carb diet Stressed regular exercise  

## 2023-02-15 NOTE — Assessment & Plan Note (Signed)
Chronic Has been stable CMP, CBC 

## 2023-02-15 NOTE — Assessment & Plan Note (Signed)
Chronic BP well controlled Continue amlodipine 10 mg daily cmp  

## 2023-02-15 NOTE — Progress Notes (Signed)
Subjective:    Patient ID: Steven Matthews, male    DOB: [birth year], 89 y.o.   MRN: [id number]     HPI Steven Matthews is here for follow up of his chronic medical problems.  He his here with his niece.    Medications and allergies reviewed with patient and updated if appropriate.  Current Outpatient Medications on File Prior to Visit  Medication Sig Dispense Refill   ALPRAZolam (XANAX) 0.25 MG tablet TAKE 1 TABLET(0.25 MG) BY MOUTH EVERY 6 HOURS AS NEEDED 60 tablet 5   amLODipine (NORVASC) 10 MG tablet TAKE 1 TABLET(10 MG) BY MOUTH DAILY 90 tablet 2   levothyroxine (SYNTHROID) 88 MCG tablet Take 1 tablet (88 mcg total) by mouth daily. 90 tablet 3   pantoprazole (PROTONIX) 40 MG tablet TAKE 1 TABLET BY MOUTH TWICE DAILY 30 MINUTES BEFORE BREAKFAST AND DINNER 180 tablet 1   pravastatin (PRAVACHOL) 80 MG tablet TAKE 1 TABLET(80 MG) BY MOUTH AT BEDTIME 90 tablet 1   tamsulosin (FLOMAX) 0.4 MG CAPS capsule TAKE 1 CAPSULE(0.4 MG) BY MOUTH DAILY AFTER SUPPER 90 capsule 2   No current facility-administered medications on file prior to visit.     Review of Systems  Constitutional:  Negative for appetite change and fever.  Respiratory:  Positive for cough (chronic). Negative for shortness of breath and wheezing.   Cardiovascular:  Negative for chest pain, palpitations and leg swelling.  Neurological:  Negative for light-headedness and headaches.  Psychiatric/Behavioral:  Negative for sleep disturbance.        Objective:   Vitals:   02/15/23 1336  BP: 138/82  Pulse: 85  Temp: 97.9 F (36.6 C)  SpO2: 94%   BP Readings from Last 3 Encounters:  02/15/23 138/82  12/28/22 (!) 141/77  08/17/22 136/72   Wt Readings from Last 3 Encounters:  02/15/23 148 lb (67.1 kg)  08/17/22 147 lb (66.7 kg)  02/09/22 154 lb 6.4 oz (70 kg)   Body mass index is 24.63 kg/m.    Physical Exam Constitutional:      General: He is not in acute distress.    Appearance: Normal appearance. He is not  ill-appearing.  HENT:     Head: Normocephalic and atraumatic.  Eyes:     Conjunctiva/sclera: Conjunctivae normal.  Cardiovascular:     Rate and Rhythm: Normal rate and regular rhythm.     Heart sounds: Normal heart sounds.  Pulmonary:     Effort: Pulmonary effort is normal. No respiratory distress.     Breath sounds: Normal breath sounds. No wheezing or rales.  Musculoskeletal:     Right lower leg: No edema.     Left lower leg: No edema.  Skin:    General: Skin is warm and dry.     Findings: No rash.  Neurological:     Mental Status: He is alert. Mental status is at baseline.  Psychiatric:        Mood and Affect: Mood normal.        Lab Results  Component Value Date   WBC 5.7 08/17/2022   HGB 11.7 (L) 08/17/2022   HCT 36.0 (L) 08/17/2022   PLT 229.0 08/17/2022   GLUCOSE 113 (H) 08/17/2022   CHOL 163 08/17/2022   TRIG 90.0 08/17/2022   HDL 60.90 08/17/2022   LDLCALC 84 08/17/2022   ALT 10 08/17/2022   AST 19 08/17/2022   NA 138 08/17/2022   K 4.1 08/17/2022   CL 102 08/17/2022   CREATININE 1.52 (  H) 08/17/2022   BUN 18 08/17/2022   CO2 26 08/17/2022   TSH 1.79 08/17/2022   HGBA1C 6.5 08/17/2022     Assessment & Plan:    See Problem List for Assessment and Plan of chronic medical problems.

## 2023-02-15 NOTE — Assessment & Plan Note (Signed)
Chronic  Clinically euthyroid Check tsh and will titrate med dose if needed Currently taking levothyroxine 88 mcg daily 

## 2023-02-15 NOTE — Assessment & Plan Note (Signed)
Chronic Regular exercise and healthy diet encouraged Check lipid panel, cmp Continue pravastatin 80 mg daily

## 2023-02-16 LAB — TSH: TSH: 0.22 u[IU]/mL — ABNORMAL LOW (ref 0.35–5.50)

## 2023-02-21 ENCOUNTER — Encounter: Payer: Self-pay | Admitting: Internal Medicine

## 2023-02-21 ENCOUNTER — Other Ambulatory Visit: Payer: Self-pay | Admitting: Internal Medicine

## 2023-02-21 MED ORDER — LEVOTHYROXINE SODIUM 88 MCG PO TABS: 88.0000 ug | ORAL_TABLET | Freq: Every day | ORAL | 3 refills | Status: DC

## 2023-03-08 ENCOUNTER — Telehealth: Payer: Self-pay

## 2023-03-08 NOTE — Telephone Encounter (Signed)
Called patient to schedule Medicare Annual Wellness Visit (AWV). No voicemail available to leave a message.  Last date of AWV: 03/02/22  Please schedule an appointment at any time with [facility] on Annual Wellness Schedule.    Agnes Lawrence, CMA (AAMA)  [facility]- AWV Program [phone]

## 2023-04-13 ENCOUNTER — Encounter: Payer: Self-pay | Admitting: Internal Medicine

## 2023-04-13 ENCOUNTER — Other Ambulatory Visit: Payer: Self-pay | Admitting: Internal Medicine

## 2023-04-14 ENCOUNTER — Ambulatory Visit (INDEPENDENT_AMBULATORY_CARE_PROVIDER_SITE_OTHER): Payer: 59 | Admitting: Podiatry

## 2023-04-14 ENCOUNTER — Encounter: Payer: Self-pay | Admitting: Podiatry

## 2023-04-14 DIAGNOSIS — M79674 Pain in right toe(s): Secondary | ICD-10-CM | POA: Diagnosis not present

## 2023-04-14 DIAGNOSIS — M79675 Pain in left toe(s): Secondary | ICD-10-CM | POA: Diagnosis not present

## 2023-04-14 DIAGNOSIS — B351 Tinea unguium: Secondary | ICD-10-CM | POA: Diagnosis not present

## 2023-04-18 NOTE — Progress Notes (Signed)
  Subjective:  Patient ID: Steven Matthews, male    DOB: 1931-08-14,  MRN: [id number]  Steven Matthews presents to clinic today for:  Chief Complaint  Patient presents with   Nail Problem    [facility] PCP-Burns PCP VST-01/2023    PCP is Burns, Bobette Mo, MD.  No Known Allergies  Review of Systems: Negative except as noted in the HPI.  Objective: No changes noted in today's physical examination. There were no vitals filed for this visit.  Steven Matthews is a pleasant 89 y.o. male in NAD. AAO x 3.  Vascular Examination: CFT <3 seconds b/l. DP/PT pulses faintly palpable b/l. Skin temperature gradient warm to warm b/l. No pain with calf compression. No ischemia or gangrene. No cyanosis or clubbing noted b/l. No edema noted b/l LE.   Neurological Examination: Sensation grossly intact b/l with 10 gram monofilament. Vibratory sensation intact b/l.   Dermatological Examination: Pedal skin warm and supple b/l.   No open wounds. No interdigital macerations.  Toenails 1-5 b/l thick, discolored, elongated with subungual debris and pain on dorsal palpation.    No hyperkeratotic nor porokeratotic lesions present on today's visit.  Musculoskeletal Examination: Normal muscle strength 5/5 to all lower extremity muscle groups bilaterally. No pain, crepitus or joint limitation noted with ROM b/l LE. No gross bony pedal deformities b/l. Patient ambulates independently without assistive aids.  Radiographs: None  Last A1c:      Latest Ref Rng & Units 02/15/2023    2:11 PM 08/17/2022    2:08 PM  Hemoglobin A1C  Hemoglobin-A1c 4.6 - 6.5 % 6.5  6.5    Assessment/Plan: 1. Pain due to onychomycosis of toenails of both feet      Patient was evaluated and treated. All patient's and/or POA's questions/concerns addressed on today's visit. Toenails 1-5 debrided in length and girth without incident. Continue soft, supportive shoe gear daily. Report any pedal injuries to medical professional. Call office if there are  any questions/concerns. -Patient/POA to call should there be question/concern in the interim.   Return in about 3 months (around 07/15/2023).  [name], DPM

## 2023-07-08 ENCOUNTER — Encounter (INDEPENDENT_AMBULATORY_CARE_PROVIDER_SITE_OTHER): Payer: Self-pay

## 2023-07-19 ENCOUNTER — Ambulatory Visit: Payer: 59 | Admitting: Podiatry

## 2023-07-19 ENCOUNTER — Encounter: Payer: Self-pay | Admitting: Podiatry

## 2023-07-19 DIAGNOSIS — M79675 Pain in left toe(s): Secondary | ICD-10-CM | POA: Diagnosis not present

## 2023-07-19 DIAGNOSIS — M79674 Pain in right toe(s): Secondary | ICD-10-CM

## 2023-07-19 DIAGNOSIS — I83893 Varicose veins of bilateral lower extremities with other complications: Secondary | ICD-10-CM | POA: Diagnosis not present

## 2023-07-19 DIAGNOSIS — B351 Tinea unguium: Secondary | ICD-10-CM

## 2023-07-19 NOTE — Progress Notes (Unsigned)
  Subjective:  Patient ID: Steven Matthews, male    DOB: October 04, 1931,  MRN: [id number]  [name] presents to clinic today for painful thick toenails that are difficult to trim. Pain interferes with ambulation. Aggravating factors include wearing enclosed shoe gear. Pain is relieved with periodic professional debridement.  Chief Complaint  Patient presents with   Nail Problem    Referring Provider RFC,Burns, Bobette Mo, MD,lov:03/24        New problem(s): None.   PCP is Pincus Sanes, MD.  No Known Allergies  Review of Systems: Negative except as noted in the HPI.  Objective: No changes noted in today's physical examination. There were no vitals filed for this visit. Steven Matthews is a pleasant 89 y.o. male WD, WN in NAD. AAO x 3.  Vascular Examination: CFT <3 seconds b/l. DP/PT pulses faintly palpable b/l. Skin temperature gradient warm to warm b/l. No pain with calf compression. No ischemia or gangrene. No cyanosis or clubbing noted b/l. Nonpitting edema noted left lower extremity. Varicosities present b/l. Evidence of chronic venous insufficiency b/l LE. Skin of medial aspect of right leg feels bound down and ropey.         Neurological Examination: Sensation grossly intact b/l with 10 gram monofilament. Vibratory sensation intact b/l.   Dermatological Examination: Pedal skin warm and supple b/l.   No open wounds. No interdigital macerations.  Toenails 1-5 b/l thick, discolored, elongated with subungual debris and pain on dorsal palpation.    No hyperkeratotic nor porokeratotic lesions present on today's visit.  Musculoskeletal Examination: Muscle strength 5/5 to all lower extremity muscle groups bilaterally. No pain, crepitus or joint limitation noted with ROM bilateral LE. No gross bony deformities bilaterally.  Radiographs: None  Last A1c:      Latest Ref Rng & Units 02/15/2023    2:11 PM 08/17/2022    2:08 PM  Hemoglobin A1C  Hemoglobin-A1c 4.6 - 6.5 % 6.5  6.5     Assessment/Plan: 1. Pain due to onychomycosis of toenails of both feet   2. Varicose veins of lower extremity with edema, bilateral     -Patient's family member present. All questions/concerns addressed on today's visit. -Discussed edema of left leg with patient's niece. Patient asymptomatic and this appears chronic. Patient denies any calf pain. Niece will discuss with PCP. -Patient to continue soft, supportive shoe gear daily. -Toenails 1-5 b/l were debrided in length and girth with sterile nail nippers without iatrogenic bleeding.  -Patient/POA to call should there be question/concern in the interim.   Return in about 3 months (around 10/19/2023).  [name], DPM

## 2023-08-15 ENCOUNTER — Other Ambulatory Visit: Payer: Self-pay | Admitting: Internal Medicine

## 2023-08-22 ENCOUNTER — Encounter: Payer: Self-pay | Admitting: Internal Medicine

## 2023-08-22 NOTE — Patient Instructions (Addendum)
    An [place] of his left leg has been ordered   Flu immunization administered today.      Blood work was ordered.   The lab is on the first floor.    Medications changes include :   none     Return in about 6 months (around 02/20/2024) for follow up.

## 2023-08-22 NOTE — Progress Notes (Unsigned)
Subjective:    Patient ID: Steven Matthews, male    DOB: Mar 24, 1931, 89 y.o.   MRN: [id number]     HPI Steven Matthews is here for follow up of his chronic medical problems. He is here today with his niece who is his caregiver.  Left leg swollen.  His niece is unsure how long that has been going on.  He has not complained about it.  He does sit a lot and does not elevate his legs consistently.  Medications and allergies reviewed with patient and updated if appropriate.  Current Outpatient Medications on File Prior to Visit  Medication Sig Dispense Refill   ALPRAZolam (XANAX) 0.25 MG tablet TAKE 1 TABLET(0.25 MG) BY MOUTH EVERY 6 HOURS AS NEEDED 60 tablet 5   amLODipine (NORVASC) 10 MG tablet TAKE 1 TABLET(10 MG) BY MOUTH DAILY 90 tablet 2   levothyroxine (SYNTHROID) 88 MCG tablet Take 1 tablet (88 mcg total) by mouth daily. Take 6 days a week only. 90 tablet 3   pantoprazole (PROTONIX) 40 MG tablet TAKE 1 TABLET BY MOUTH TWICE DAILY 30 MINUTES BEFORE BREAKFAST AND DINNER 180 tablet 1   pravastatin (PRAVACHOL) 80 MG tablet TAKE 1 TABLET(80 MG) BY MOUTH AT BEDTIME 90 tablet 1   tamsulosin (FLOMAX) 0.4 MG CAPS capsule TAKE 1 CAPSULE(0.4 MG) BY MOUTH DAILY AFTER SUPPER 90 capsule 2   No current facility-administered medications on file prior to visit.     Review of Systems  Constitutional:  Negative for appetite change and fever.  Respiratory:  Negative for cough, shortness of breath and wheezing.   Cardiovascular:  Positive for leg swelling (left leg). Negative for chest pain and palpitations.  Neurological:  Negative for dizziness, light-headedness and headaches.  Psychiatric/Behavioral:  Negative for sleep disturbance.        Objective:   Vitals:   08/23/23 1259 08/23/23 1332  BP: (!) 146/70 132/80  Pulse: 71   Temp: 98 F (36.7 C)   SpO2: 92%    BP Readings from Last 3 Encounters:  08/23/23 132/80  02/15/23 138/82  12/28/22 (!) 141/77   Wt Readings from Last 3 Encounters:   08/23/23 157 lb (71.2 kg)  02/15/23 148 lb (67.1 kg)  08/17/22 147 lb (66.7 kg)   Body mass index is 26.13 kg/m.    Physical Exam Constitutional:      General: He is not in acute distress.    Appearance: Normal appearance. He is not ill-appearing.  HENT:     Head: Normocephalic and atraumatic.  Eyes:     Conjunctiva/sclera: Conjunctivae normal.  Cardiovascular:     Rate and Rhythm: Normal rate and regular rhythm.     Heart sounds: Normal heart sounds.  Pulmonary:     Effort: Pulmonary effort is normal. No respiratory distress.     Breath sounds: Normal breath sounds. No wheezing or rales.  Musculoskeletal:     Right lower leg: No edema.     Left lower leg: No edema.  Skin:    General: Skin is warm and dry.     Findings: No rash.  Neurological:     Mental Status: He is alert. Mental status is at baseline.  Psychiatric:        Mood and Affect: Mood normal.        Lab Results  Component Value Date   WBC 6.5 02/15/2023   HGB 13.2 02/15/2023   HCT 39.6 02/15/2023   PLT 248.0 02/15/2023   GLUCOSE 110 (H) 08/23/2023  CHOL 195 08/23/2023   TRIG 120.0 08/23/2023   HDL 69.30 08/23/2023   LDLCALC 102 (H) 08/23/2023   ALT 12 08/23/2023   AST 21 08/23/2023   NA 139 08/23/2023   K 4.5 08/23/2023   CL 104 08/23/2023   CREATININE 1.64 (H) 08/23/2023   BUN 20 08/23/2023   CO2 26 08/23/2023   TSH 2.28 08/23/2023   HGBA1C 5.9 08/23/2023     Assessment & Plan:    See Problem List for Assessment and Plan of chronic medical problems.

## 2023-08-23 ENCOUNTER — Ambulatory Visit (INDEPENDENT_AMBULATORY_CARE_PROVIDER_SITE_OTHER): Payer: 59 | Admitting: Internal Medicine

## 2023-08-23 VITALS — BP 132/80 | HR 71 | Temp 98.0°F | Ht 65.0 in | Wt 157.0 lb

## 2023-08-23 DIAGNOSIS — N1832 Chronic kidney disease, stage 3b: Secondary | ICD-10-CM

## 2023-08-23 DIAGNOSIS — N401 Enlarged prostate with lower urinary tract symptoms: Secondary | ICD-10-CM

## 2023-08-23 DIAGNOSIS — E1169 Type 2 diabetes mellitus with other specified complication: Secondary | ICD-10-CM | POA: Diagnosis not present

## 2023-08-23 DIAGNOSIS — E7849 Other hyperlipidemia: Secondary | ICD-10-CM | POA: Diagnosis not present

## 2023-08-23 DIAGNOSIS — F419 Anxiety disorder, unspecified: Secondary | ICD-10-CM

## 2023-08-23 DIAGNOSIS — I10 Essential (primary) hypertension: Secondary | ICD-10-CM

## 2023-08-23 DIAGNOSIS — M7989 Other specified soft tissue disorders: Secondary | ICD-10-CM | POA: Insufficient documentation

## 2023-08-23 DIAGNOSIS — K219 Gastro-esophageal reflux disease without esophagitis: Secondary | ICD-10-CM

## 2023-08-23 DIAGNOSIS — Z23 Encounter for immunization: Secondary | ICD-10-CM

## 2023-08-23 DIAGNOSIS — N138 Other obstructive and reflux uropathy: Secondary | ICD-10-CM

## 2023-08-23 DIAGNOSIS — D649 Anemia, unspecified: Secondary | ICD-10-CM | POA: Diagnosis not present

## 2023-08-23 DIAGNOSIS — E039 Hypothyroidism, unspecified: Secondary | ICD-10-CM | POA: Diagnosis not present

## 2023-08-23 LAB — LIPID PANEL
Cholesterol: 195 mg/dL (ref 0–200)
HDL: 69.3 mg/dL (ref >39.00)
LDL Cholesterol: 102 mg/dL — ABNORMAL HIGH (ref 0–99)
NonHDL: 125.58
Total CHOL/HDL Ratio: 3
Triglycerides: 120 mg/dL (ref 0.0–149.0)
VLDL: 24 mg/dL (ref 0.0–40.0)

## 2023-08-23 LAB — COMPREHENSIVE METABOLIC PANEL
ALT: 12 U/L (ref 0–53)
AST: 21 U/L (ref 0–37)
Albumin: 4.3 g/dL (ref 3.5–5.2)
Alkaline Phosphatase: 60 U/L (ref 39–117)
BUN: 20 mg/dL (ref 6–23)
CO2: 26 meq/L (ref 19–32)
Calcium: 9.7 mg/dL (ref 8.4–10.5)
Chloride: 104 meq/L (ref 96–112)
Creatinine, Ser: 1.64 mg/dL — ABNORMAL HIGH (ref 0.40–1.50)
GFR: 36.58 mL/min — ABNORMAL LOW (ref >60.00)
Glucose, Bld: 110 mg/dL — ABNORMAL HIGH (ref 70–99)
Potassium: 4.5 meq/L (ref 3.5–5.1)
Sodium: 139 meq/L (ref 135–145)
Total Bilirubin: 0.8 mg/dL (ref 0.2–1.2)
Total Protein: 8.3 g/dL (ref 6.0–8.3)

## 2023-08-23 LAB — TSH: TSH: 2.28 u[IU]/mL (ref 0.35–5.50)

## 2023-08-23 LAB — HEMOGLOBIN A1C: Hgb A1c MFr Bld: 5.9 % (ref 4.6–6.5)

## 2023-08-23 NOTE — Assessment & Plan Note (Signed)
Chronic  Clinically euthyroid Check tsh and will titrate med dose if needed Currently taking levothyroxine 88 mcg daily

## 2023-08-23 NOTE — Assessment & Plan Note (Signed)
Chronic Regular exercise and healthy diet encouraged Check lipid panel, cmp Continue pravastatin 80 mg daily

## 2023-08-23 NOTE — Assessment & Plan Note (Addendum)
Chronic BP initially elevated-better on repeat Continue amlodipine 10 mg daily cmp

## 2023-08-23 NOTE — Assessment & Plan Note (Signed)
Chronic Has been stable CMP

## 2023-08-23 NOTE — Assessment & Plan Note (Signed)
Acute Left leg is definitely more swollen than right leg No pain, no shortness of breath, no skin changes or fevers Need to rule out DVT-ultrasound ordered and his niece will get this scheduled when she is able to take him Discussed possibly changing amlodipine, but the swelling is not bothering him and his blood pressure is well-controlled and we are both concerned about other medications having more side effects Treatment based on ultrasound results She will try to encourage him to elevate his leg more consistently

## 2023-08-23 NOTE — Assessment & Plan Note (Signed)
Chronic Controlled, Stable Continue alprazolam 0.25 mg every 6 hours as needed 

## 2023-08-23 NOTE — Assessment & Plan Note (Signed)
Chronic Lab Results  Component Value Date   HGBA1C 6.5 02/15/2023   Check A1c Diet controlled Low sugar / carb diet Stressed regular exercise

## 2023-08-23 NOTE — Assessment & Plan Note (Signed)
Chronic Mild Likely related to CKD

## 2023-08-23 NOTE — Assessment & Plan Note (Signed)
Chronic GERD controlled Continue pantoprazole 40 mg daily 

## 2023-08-23 NOTE — Assessment & Plan Note (Signed)
Chronic Controlled Continue Flomax 0.4 mg daily Following with urology

## 2023-08-30 ENCOUNTER — Ambulatory Visit (HOSPITAL_COMMUNITY)
Admission: RE | Admit: 2023-08-30 | Payer: 59 | Source: Ambulatory Visit | Attending: Internal Medicine | Admitting: Internal Medicine

## 2023-09-06 ENCOUNTER — Ambulatory Visit (HOSPITAL_COMMUNITY)
Admission: RE | Admit: 2023-09-06 | Discharge: 2023-09-06 | Disposition: A | Discharge status: Home / Self Care | Payer: 59 | Source: Ambulatory Visit | Attending: Cardiology | Admitting: Cardiology

## 2023-09-06 ENCOUNTER — Encounter: Payer: Self-pay | Admitting: Internal Medicine

## 2023-09-06 ENCOUNTER — Other Ambulatory Visit: Payer: Self-pay | Admitting: Internal Medicine

## 2023-09-06 DIAGNOSIS — M7989 Other specified soft tissue disorders: Secondary | ICD-10-CM | POA: Insufficient documentation

## 2023-09-06 DIAGNOSIS — I82402 Acute embolism and thrombosis of unspecified deep veins of left lower extremity: Secondary | ICD-10-CM | POA: Insufficient documentation

## 2023-09-06 MED ORDER — APIXABAN 2.5 MG PO TABS: 2.5000 mg | ORAL_TABLET | Freq: Two times a day (BID) | ORAL | 5 refills | Status: DC

## 2023-09-06 NOTE — Addendum Note (Signed)
Addended by: Pincus Sanes on: 09/06/2023 03:52 PM   Modules accepted: Orders

## 2023-09-09 ENCOUNTER — Encounter: Payer: Self-pay | Admitting: Internal Medicine

## 2023-10-18 DIAGNOSIS — R350 Frequency of micturition: Secondary | ICD-10-CM | POA: Diagnosis not present

## 2023-10-27 ENCOUNTER — Ambulatory Visit: Payer: 59 | Admitting: Podiatry

## 2023-11-01 ENCOUNTER — Encounter: Payer: Self-pay | Admitting: Podiatry

## 2023-11-01 ENCOUNTER — Ambulatory Visit (INDEPENDENT_AMBULATORY_CARE_PROVIDER_SITE_OTHER): Payer: 59 | Admitting: Podiatry

## 2023-11-01 VITALS — Ht 65.0 in | Wt 157.0 lb

## 2023-11-01 DIAGNOSIS — M79675 Pain in left toe(s): Secondary | ICD-10-CM | POA: Diagnosis not present

## 2023-11-01 DIAGNOSIS — M79674 Pain in right toe(s): Secondary | ICD-10-CM

## 2023-11-01 DIAGNOSIS — B351 Tinea unguium: Secondary | ICD-10-CM

## 2023-11-01 NOTE — Progress Notes (Signed)
  Subjective:  Patient ID: Steven Matthews, male    DOB: Jan 10, 1931,  MRN: [id number]  89 y.o. male presents with painful elongated mycotic toenails 1-5 bilaterally which are tender when wearing enclosed shoe gear. Pain is relieved with periodic professional debridement. He is accompanied by his niece on today's visit. Patient now on blood thinners for DVT LLE.  Chief Complaint  Patient presents with   Nail Problem    Pt is here for [facility], Not a diabetic, PCP is Dr Lawerance Bach and LOV was in September.    PCP: Pincus Sanes, MD.  New problem(s): None.   Review of Systems: Negative except as noted in the HPI.   No Known Allergies  Objective:  There were no vitals filed for this visit. Constitutional Patient is a pleasant 89 y.o. male WD, WN in NAD. AAO x 3.  Vascular CFT <3 seconds b/l. DP/PT pulses faintly palpable b/l. Skin temperature gradient warm to warm b/l. No pain with calf compression. No ischemia or gangrene. No cyanosis or clubbing noted b/l. Nonpitting edema noted left lower extremity. Varicosities present b/l. Evidence of chronic venous insufficiency b/l LE. Skin of medial aspect of right leg feels bound down and ropey.  Neurologic Protective sensation intact 5/5 intact bilaterally with 10g monofilament b/l. Vibratory sensation intact b/l. No clonus b/l.   Dermatologic Pedal skin is thin, shiny and atrophic b/l.  No open wounds b/l lower extremities. No interdigital macerations b/l lower extremities. Toenails 1-5 b/l elongated, discolored, dystrophic, thickened, crumbly with subungual debris and tenderness to dorsal palpation. No corns, calluses nor porokeratotic lesions noted.  Orthopedic: Normal muscle strength 5/5 to all lower extremity muscle groups bilaterally. No pain, crepitus or joint limitation noted with ROM bilateral LE. No gross bony deformities bilaterally.   Last HgA1c:     Latest Ref Rng & Units 08/23/2023    1:38 PM 02/15/2023    2:11 PM  Hemoglobin A1C  Hemoglobin-A1c 4.6  - 6.5 % 5.9  6.5    Assessment:   1. Pain due to onychomycosis of toenails of both feet    Plan:  Patient was evaluated and treated and all questions answered.  -Patient's family member present. All questions/concerns addressed on today's visit. -Continue supportive shoe gear daily. -Mycotic toenails 1-5 bilaterally were debrided in length and girth with sterile nail nippers and dremel without incident. -Patient/POA to call should there be question/concern in the interim.  Return in about 3 months (around 01/30/2024).  [name], DPM      [place] LOCATION: [address] [place] [address]                   Office [phone]   [facility] LOCATION: [address] [place], [place] [address] Office [phone]

## 2023-11-09 ENCOUNTER — Other Ambulatory Visit: Payer: Self-pay | Admitting: Internal Medicine

## 2023-12-05 ENCOUNTER — Other Ambulatory Visit: Payer: Self-pay | Admitting: Internal Medicine

## 2023-12-07 ENCOUNTER — Other Ambulatory Visit: Payer: Self-pay | Admitting: Internal Medicine

## 2024-01-31 ENCOUNTER — Ambulatory Visit (INDEPENDENT_AMBULATORY_CARE_PROVIDER_SITE_OTHER): Payer: 59 | Admitting: Podiatry

## 2024-01-31 ENCOUNTER — Encounter: Payer: Self-pay | Admitting: Podiatry

## 2024-01-31 DIAGNOSIS — M79674 Pain in right toe(s): Secondary | ICD-10-CM | POA: Diagnosis not present

## 2024-01-31 DIAGNOSIS — M79675 Pain in left toe(s): Secondary | ICD-10-CM

## 2024-01-31 DIAGNOSIS — B351 Tinea unguium: Secondary | ICD-10-CM | POA: Diagnosis not present

## 2024-02-03 NOTE — Progress Notes (Signed)
  Subjective:  Patient ID: Steven Matthews, male    DOB: 11/20/1931,  MRN: [id number]  89 y.o. male presents painful, elongated thickened toenails x 10 which are symptomatic when wearing enclosed shoe gear. This interferes with his/her daily activities. He remains on Eliquis for DVT.  New problem(s): None   PCP is Pincus Sanes, MD , and last visit was August 23, 2023.  No Known Allergies  Review of Systems: Negative except as noted in the HPI.   Objective:  Steven Matthews is a pleasant 89 y.o. male WD, WN in NAD. AAO x 3.  Vascular Examination: CFT <3 seconds b/l. DP/PT pulses faintly palpable b/l. Skin temperature gradient warm to warm b/l. No pain with calf compression. No ischemia or gangrene. No cyanosis or clubbing noted b/l. Varicosities present b/l. Evidence of chronic venous insufficiency b/l LE. Skin of medial aspect of right leg feels bound down and ropey.  Neurological Examination: Sensation grossly intact b/l with 10 gram monofilament. Vibratory sensation intact b/l.  Dermatological Examination: Pedal skin with normal turgor, texture and tone b/l. No open wounds nor interdigital macerations noted. Toenails 1-5 b/l thick, discolored, elongated with subungual debris and pain on dorsal palpation. No hyperkeratotic lesions noted b/l.   Musculoskeletal Examination: Muscle strength 5/5 to b/l LE.  No pain, crepitus noted b/l. No gross pedal deformities. Patient ambulates independently without assistive aids.   Radiographs: None  Last A1c:      Latest Ref Rng & Units 08/23/2023    1:38 PM 02/15/2023    2:11 PM  Hemoglobin A1C  Hemoglobin-A1c 4.6 - 6.5 % 5.9  6.5      Assessment:   1. Pain due to onychomycosis of toenails of both feet    Plan:  -Patient's family member present. All questions/concerns addressed on today's visit. -Patient to continue soft, supportive shoe gear daily. -Toenails 1-5 b/l were debrided in length and girth with sterile nail nippers and dremel  without iatrogenic bleeding.  -Patient/POA to call should there be question/concern in the interim.  Return in about 3 months (around 05/02/2024).  [name], DPM      [place] LOCATION: [address] [place] [address]                   Office [phone]   [facility] LOCATION: [address] [place], [place] [address] Office [phone]

## 2024-02-09 ENCOUNTER — Other Ambulatory Visit: Payer: Self-pay | Admitting: Internal Medicine

## 2024-02-20 ENCOUNTER — Encounter: Payer: Self-pay | Admitting: Internal Medicine

## 2024-02-20 NOTE — Progress Notes (Unsigned)
 Subjective:    Patient ID: Steven Matthews, male    DOB: [birth year], 89 y.o.   MRN: [id number]     HPI Steven Matthews is here for follow up of his chronic medical problems.  Pain in right shoulder x 2 weeks.  No obvious cause.  Using heating pad, ibuprofen and aleve.  Pain with moving arm and head in certain directions.   Medications and allergies reviewed with patient and updated if appropriate.  Current Outpatient Medications on File Prior to Visit  Medication Sig Dispense Refill   ALPRAZolam (XANAX) 0.25 MG tablet TAKE 1 TABLET(0.25 MG) BY MOUTH EVERY 6 HOURS AS NEEDED 60 tablet 5   amLODipine (NORVASC) 10 MG tablet TAKE 1 TABLET(10 MG) BY MOUTH DAILY 90 tablet 2   apixaban (ELIQUIS) 2.5 MG TABS tablet Take 1 tablet (2.5 mg total) by mouth 2 (two) times daily. 60 tablet 5   levothyroxine (SYNTHROID) 88 MCG tablet TAKE 1 TABLET(88 MCG) BY MOUTH DAILY 90 tablet 3   pantoprazole (PROTONIX) 40 MG tablet TAKE 1 TABLET BY MOUTH TWICE DAILY 30 MINUTES BEFORE BREAKFAST AND DINNER 180 tablet 1   pravastatin (PRAVACHOL) 80 MG tablet TAKE 1 TABLET(80 MG) BY MOUTH AT BEDTIME 90 tablet 1   tamsulosin (FLOMAX) 0.4 MG CAPS capsule TAKE 1 CAPSULE(0.4 MG) BY MOUTH DAILY AFTER SUPPER 90 capsule 2   No current facility-administered medications on file prior to visit.     Review of Systems  Constitutional:  Negative for fever.  Respiratory:  Positive for cough (chronic) and wheezing (occ). Negative for shortness of breath.   Cardiovascular:  Positive for leg swelling (mild). Negative for chest pain and palpitations.  Gastrointestinal:  Negative for abdominal pain, constipation and diarrhea.  Genitourinary:  Negative for difficulty urinating.  Neurological:  Negative for light-headedness and headaches.       Objective:   Vitals:   02/21/24 1256  BP: 134/62  Pulse: 65  Temp: 97.8 F (36.6 C)  SpO2: 93%   BP Readings from Last 3 Encounters:  02/21/24 134/62  08/23/23 132/80  02/15/23 138/82    Wt Readings from Last 3 Encounters:  02/21/24 151 lb (68.5 kg)  11/01/23 157 lb (71.2 kg)  08/23/23 157 lb (71.2 kg)   Body mass index is 25.13 kg/m.    Physical Exam Constitutional:      General: He is not in acute distress.    Appearance: Normal appearance. He is not ill-appearing.  HENT:     Head: Normocephalic and atraumatic.  Eyes:     Conjunctiva/sclera: Conjunctivae normal.  Cardiovascular:     Rate and Rhythm: Normal rate and regular rhythm.     Heart sounds: Normal heart sounds.  Pulmonary:     Effort: Pulmonary effort is normal. No respiratory distress.     Breath sounds: Normal breath sounds. No wheezing or rales.  Musculoskeletal:        General: Tenderness (pain with passive movement or right shoulder.  some tenderness b/l trapezius, and left shoulder) present.     Right lower leg: Edema (mild) present.     Left lower leg: Edema (mild) present.  Skin:    General: Skin is warm and dry.     Findings: No rash.  Neurological:     Mental Status: He is alert. Mental status is at baseline.  Psychiatric:        Mood and Affect: Mood normal.        Lab Results  Component Value Date  WBC 6.5 02/15/2023   HGB 13.2 02/15/2023   HCT 39.6 02/15/2023   PLT 248.0 02/15/2023   GLUCOSE 110 (H) 08/23/2023   CHOL 195 08/23/2023   TRIG 120.0 08/23/2023   HDL 69.30 08/23/2023   LDLCALC 102 (H) 08/23/2023   ALT 12 08/23/2023   AST 21 08/23/2023   NA 139 08/23/2023   K 4.5 08/23/2023   CL 104 08/23/2023   CREATININE 1.64 (H) 08/23/2023   BUN 20 08/23/2023   CO2 26 08/23/2023   TSH 2.28 08/23/2023   HGBA1C 5.9 08/23/2023     Assessment & Plan:    See Problem List for Assessment and Plan of chronic medical problems.

## 2024-02-20 NOTE — Patient Instructions (Addendum)
      Blood work was ordered.       Medications changes include :   None   Tylenol as needed  - ok ot take up to 3000 mg / day    Return in about 6 months (around 08/22/2024) for follow up.

## 2024-02-21 ENCOUNTER — Ambulatory Visit (INDEPENDENT_AMBULATORY_CARE_PROVIDER_SITE_OTHER): Payer: 59 | Admitting: Internal Medicine

## 2024-02-21 ENCOUNTER — Encounter: Payer: Self-pay | Admitting: Internal Medicine

## 2024-02-21 VITALS — BP 134/62 | HR 65 | Temp 97.8°F | Ht 65.0 in | Wt 151.0 lb

## 2024-02-21 DIAGNOSIS — E7849 Other hyperlipidemia: Secondary | ICD-10-CM

## 2024-02-21 DIAGNOSIS — E039 Hypothyroidism, unspecified: Secondary | ICD-10-CM | POA: Diagnosis not present

## 2024-02-21 DIAGNOSIS — N1832 Chronic kidney disease, stage 3b: Secondary | ICD-10-CM | POA: Diagnosis not present

## 2024-02-21 DIAGNOSIS — D649 Anemia, unspecified: Secondary | ICD-10-CM

## 2024-02-21 DIAGNOSIS — I10 Essential (primary) hypertension: Secondary | ICD-10-CM | POA: Diagnosis not present

## 2024-02-21 DIAGNOSIS — N138 Other obstructive and reflux uropathy: Secondary | ICD-10-CM

## 2024-02-21 DIAGNOSIS — N401 Enlarged prostate with lower urinary tract symptoms: Secondary | ICD-10-CM

## 2024-02-21 DIAGNOSIS — E1169 Type 2 diabetes mellitus with other specified complication: Secondary | ICD-10-CM | POA: Diagnosis not present

## 2024-02-21 DIAGNOSIS — I82402 Acute embolism and thrombosis of unspecified deep veins of left lower extremity: Secondary | ICD-10-CM

## 2024-02-21 DIAGNOSIS — M25511 Pain in right shoulder: Secondary | ICD-10-CM | POA: Insufficient documentation

## 2024-02-21 DIAGNOSIS — K219 Gastro-esophageal reflux disease without esophagitis: Secondary | ICD-10-CM | POA: Diagnosis not present

## 2024-02-21 DIAGNOSIS — F419 Anxiety disorder, unspecified: Secondary | ICD-10-CM

## 2024-02-21 LAB — COMPREHENSIVE METABOLIC PANEL WITH GFR
ALT: 13 U/L (ref 0–53)
AST: 19 U/L (ref 0–37)
Albumin: 4.3 g/dL (ref 3.5–5.2)
Alkaline Phosphatase: 61 U/L (ref 39–117)
BUN: 21 mg/dL (ref 6–23)
CO2: 28 meq/L (ref 19–32)
Calcium: 9.7 mg/dL (ref 8.4–10.5)
Chloride: 103 meq/L (ref 96–112)
Creatinine, Ser: 1.64 mg/dL — ABNORMAL HIGH (ref 0.40–1.50)
GFR: 36.46 mL/min — ABNORMAL LOW (ref >60.00)
Glucose, Bld: 104 mg/dL — ABNORMAL HIGH (ref 70–99)
Potassium: 3.9 meq/L (ref 3.5–5.1)
Sodium: 140 meq/L (ref 135–145)
Total Bilirubin: 0.9 mg/dL (ref 0.2–1.2)
Total Protein: 8.3 g/dL (ref 6.0–8.3)

## 2024-02-21 LAB — LIPID PANEL
Cholesterol: 183 mg/dL (ref 0–200)
HDL: 65.1 mg/dL (ref >39.00)
LDL Cholesterol: 91 mg/dL (ref 0–99)
NonHDL: 117.46
Total CHOL/HDL Ratio: 3
Triglycerides: 131 mg/dL (ref 0.0–149.0)
VLDL: 26.2 mg/dL (ref 0.0–40.0)

## 2024-02-21 LAB — CBC WITH DIFFERENTIAL/PLATELET
Basophils Absolute: 0 10*3/uL (ref 0.0–0.1)
Basophils Relative: 0.8 % (ref 0.0–3.0)
Eosinophils Absolute: 0.2 10*3/uL (ref 0.0–0.7)
Eosinophils Relative: 3.1 % (ref 0.0–5.0)
HCT: 41.5 % (ref 39.0–52.0)
Hemoglobin: 13.4 g/dL (ref 13.0–17.0)
Lymphocytes Relative: 33.8 % (ref 12.0–46.0)
Lymphs Abs: 2.2 10*3/uL (ref 0.7–4.0)
MCHC: 32.4 g/dL (ref 30.0–36.0)
MCV: 88.6 fl (ref 78.0–100.0)
Monocytes Absolute: 0.5 10*3/uL (ref 0.1–1.0)
Monocytes Relative: 7 % (ref 3.0–12.0)
Neutro Abs: 3.6 10*3/uL (ref 1.4–7.7)
Neutrophils Relative %: 55.3 % (ref 43.0–77.0)
Platelets: 220 10*3/uL (ref 150.0–400.0)
RBC: 4.68 Mil/uL (ref 4.22–5.81)
RDW: 15.4 % (ref 11.5–15.5)
WBC: 6.4 10*3/uL (ref 4.0–10.5)

## 2024-02-21 LAB — HEMOGLOBIN A1C: Hgb A1c MFr Bld: 6 % (ref 4.6–6.5)

## 2024-02-21 LAB — TSH: TSH: 4.51 u[IU]/mL (ref 0.35–5.50)

## 2024-02-21 NOTE — Assessment & Plan Note (Signed)
Chronic Controlled Continue Flomax 0.4 mg daily Following with urology

## 2024-02-21 NOTE — Assessment & Plan Note (Signed)
 Chronic With hyperlipidemia, ckd Lab Results  Component Value Date   HGBA1C 5.9 08/23/2023   Check A1c Diet controlled Low sugar / carb diet Stressed regular exercise

## 2024-02-21 NOTE — Assessment & Plan Note (Signed)
 Chronic  Recurrent DVT Continue eliquis 2.5 mg bid Cbc, cmp

## 2024-02-21 NOTE — Assessment & Plan Note (Signed)
Chronic Regular exercise and healthy diet encouraged Check lipid panel, cmp Continue pravastatin 80 mg daily

## 2024-02-21 NOTE — Assessment & Plan Note (Signed)
Chronic Controlled, Stable Continue alprazolam 0.25 mg every 6 hours as needed 

## 2024-02-21 NOTE — Assessment & Plan Note (Signed)
 Chronic  Clinically euthyroid Check tsh and will titrate med dose if needed Currently taking levothyroxine 88 mcg daily

## 2024-02-21 NOTE — Assessment & Plan Note (Addendum)
 Chronic Mild Likely related to CKD Cmp, cbc

## 2024-02-21 NOTE — Assessment & Plan Note (Signed)
Chronic BP initially elevated-better on repeat Continue amlodipine 10 mg daily cmp

## 2024-02-21 NOTE — Assessment & Plan Note (Signed)
 Chronic Has been stable CMP, cbc

## 2024-02-21 NOTE — Assessment & Plan Note (Signed)
 Acute ? Right upper back or shoulder pain No injury  Possible OA No nsaids Continue heat, can try topical meds Tylenol prn - up to 3000 mg daily

## 2024-02-21 NOTE — Assessment & Plan Note (Signed)
 Chronic GERD controlled Continue pantoprazole 40 mg daily

## 2024-02-22 ENCOUNTER — Encounter: Payer: Self-pay | Admitting: Internal Medicine

## 2024-03-01 ENCOUNTER — Other Ambulatory Visit: Payer: Self-pay | Admitting: Internal Medicine

## 2024-03-15 ENCOUNTER — Other Ambulatory Visit: Payer: Self-pay | Admitting: Internal Medicine

## 2024-03-27 ENCOUNTER — Ambulatory Visit (INDEPENDENT_AMBULATORY_CARE_PROVIDER_SITE_OTHER)

## 2024-03-27 VITALS — Ht 65.0 in | Wt 151.0 lb

## 2024-03-27 DIAGNOSIS — Z0000 Encounter for general adult medical examination without abnormal findings: Secondary | ICD-10-CM | POA: Diagnosis not present

## 2024-03-27 NOTE — Patient Instructions (Signed)
 Steven Matthews , Thank you for taking time to come for your Medicare Wellness Visit. I appreciate your ongoing commitment to your health goals. Please review the following plan we discussed and let me know if I can assist you in the future.   Referrals/Orders/Follow-Ups/Clinician Recommendations: It was nice talking with you today.  You are due for a 2nd pneumonia vaccine and a diabetic eye exam.    This is a list of the screening recommended for you and due dates:  Health Maintenance  Topic Date Due   Eye exam for diabetics  Never done   DTaP/Tdap/Td vaccine (1 - Tdap) Never done   Zoster (Shingles) Vaccine (1 of 2) Never done   Pneumonia Vaccine (2 of 2 - PCV) 06/10/2007   Medicare Annual Wellness Visit  03/03/2023   COVID-19 Vaccine (2 - 2024-25 season) 07/25/2023   Flu Shot  06/23/2024   Hemoglobin A1C  08/22/2024   HPV Vaccine  Aged Out   Meningitis B Vaccine  Aged Out   Complete foot exam   Discontinued    Advanced directives: (Copy Requested) Please bring a copy of your health care power of attorney and living will to the office to be added to your chart at your convenience. You can mail to [facility] [address] [place], [place] [address] or email to [email or web address]  Next Medicare Annual Wellness Visit scheduled for next year: Yes  Have you seen your provider in the last 6 months (3 months if uncontrolled diabetes)? Yes

## 2024-03-27 NOTE — Progress Notes (Signed)
 Subjective:   Steven Matthews is a 89 y.o. who presents for a Medicare Wellness preventive visit.  Visit Complete: Virtual I connected with  Steven Matthews on 03/27/24 by a video and audio enabled telemedicine application and verified that I am speaking with the correct person using two identifiers.  Patient Location: Home  Provider Location: Home Office  I discussed the limitations of evaluation and management by telemedicine. The patient expressed understanding and agreed to proceed.  Vital Signs: Because this visit was a virtual/telehealth visit, some criteria may be missing or patient reported. Any vitals not documented were not able to be obtained and vitals that have been documented are patient reported.   Persons Participating in Visit: Patient assisted by his niece.  AWV Questionnaire: No: Patient Medicare AWV questionnaire was not completed prior to this visit.  Cardiac Risk Factors include: advanced age (>42men, >72 women);male gender;hypertension;diabetes mellitus;Other (see comment);dyslipidemia, Risk factor comments: OSA, CKD stage 3     Objective:    Today's Vitals   03/27/24 1546  Weight: 151 lb (68.5 kg)  Height: 5\' 5"  (1.651 m)   Body mass index is 25.13 kg/m.     03/27/2024    3:55 PM 03/02/2022    3:16 PM 02/10/2021    2:34 PM 08/16/2017   10:30 AM 04/17/2016    3:19 PM  Advanced Directives  Does Patient Have a Medical Advance Directive? Yes Yes Yes Yes No  Type of Sales promotion account executive of [employer] Power of [employer];Living will Healthcare Power of [place];Living will   Does patient want to make changes to medical advance directive?  No - Patient declined No - Patient declined    Copy of Healthcare Power of Attorney in Chart?  No - copy requested No - copy requested No - copy requested     Current Medications (verified) Outpatient Encounter Medications as of 03/27/2024  Medication Sig   acetaminophen  (TYLENOL )  650 MG CR tablet Take 650 mg by mouth every 8 (eight) hours as needed for pain.   ALPRAZolam  (XANAX ) 0.25 MG tablet TAKE 1 TABLET(0.25 MG) BY MOUTH EVERY 6 HOURS AS NEEDED   amLODipine  (NORVASC ) 10 MG tablet TAKE 1 TABLET(10 MG) BY MOUTH DAILY   ELIQUIS  2.5 MG TABS tablet TAKE 1 TABLET(2.5 MG) BY MOUTH TWICE DAILY   levothyroxine  (SYNTHROID ) 88 MCG tablet TAKE 1 TABLET(88 MCG) BY MOUTH DAILY   pantoprazole  (PROTONIX ) 40 MG tablet TAKE 1 TABLET BY MOUTH TWICE DAILY 30 MINUTES BEFORE BREAKFAST AND DINNER   pravastatin  (PRAVACHOL ) 80 MG tablet TAKE 1 TABLET(80 MG) BY MOUTH AT BEDTIME   tamsulosin  (FLOMAX ) 0.4 MG CAPS capsule TAKE 1 CAPSULE(0.4 MG) BY MOUTH DAILY AFTER SUPPER   No facility-administered encounter medications on file as of 03/27/2024.    Allergies (verified) Patient has no known allergies.   History: Past Medical History:  Diagnosis Date   Hyperlipidemia    Hypertension    Prostate cancer [facility])    Past Surgical History:  Procedure Laterality Date   None     Family History  Problem Relation Age of Onset   Hypertension Mother    Stroke Mother    Colon cancer Neg Hx    Esophageal cancer Neg Hx    Stomach cancer Neg Hx    Rectal cancer Neg Hx    Liver cancer Neg Hx    Social History   Socioeconomic History   Marital status: Single    Spouse name: Not on file  Number of children: 0   Years of education: Not on file   Highest education level: Not on file  Occupational History   Occupation: disabled  Tobacco Use   Smoking status: Never   Smokeless tobacco: Former    Types: Snuff    Quit date: 11/23/2013  Vaping Use   Vaping status: Never Used  Substance and Sexual Activity   Alcohol use: No    Alcohol/week: 0.0 standard drinks of alcohol   Drug use: No   Sexual activity: Not on file  Other Topics Concern   Not on file  Social History Narrative   Patient stays with his niece   Social Drivers of Corporate investment banker Strain: Low Risk  (03/27/2024)    Overall Financial Resource Strain (CARDIA)    Difficulty of Paying Living Expenses: Not very hard  Food Insecurity: No Food Insecurity (03/27/2024)   Hunger Vital Sign    Worried About Running Out of Food in the Last Year: Never true    Ran Out of Food in the Last Year: Never true  Transportation Needs: No Transportation Needs (03/27/2024)   PRAPARE - Administrator, Civil Service (Medical): No    Lack of Transportation (Non-Medical): No  Physical Activity: Inactive (03/27/2024)   Exercise Vital Sign    Days of Exercise per Week: 0 days    Minutes of Exercise per Session: 0 min  Stress: No Stress Concern Present (03/27/2024)   [employer] of Occupational Health - Occupational Stress Questionnaire    Feeling of Stress : Not at all  Social Connections: Socially Isolated (03/27/2024)   Social Connection and Isolation Panel [NHANES]    Frequency of Communication with Friends and Family: Never    Frequency of Social Gatherings with Friends and Family: Never    Attends Religious Services: Never    Database administrator or Organizations: No    Attends Engineer, structural: Never    Marital Status: Never married    Tobacco Counseling Counseling given: Not Answered    Clinical Intake:  Pre-visit preparation completed: Yes  Pain : No/denies pain     BMI - recorded: 25.13 Nutritional Status: BMI 25 -29 Overweight Nutritional Risks: None  Lab Results  Component Value Date   HGBA1C 6.0 02/21/2024   HGBA1C 5.9 08/23/2023   HGBA1C 6.5 02/15/2023     How often do you need to have someone help you when you read instructions, pamphlets, or other written materials from your doctor or pharmacy?: 4 - Often  Interpreter Needed?: No  Information entered by :: Sharnise Blough, RMA   Activities of Daily Living     03/27/2024    3:46 PM  In your present state of health, do you have any difficulty performing the following activities:  Hearing? 0  Vision? 0   Difficulty concentrating or making decisions? 0  Walking or climbing stairs? 0  Dressing or bathing? 0  Doing errands, shopping? 0  Preparing Food and eating ? N  Using the Toilet? N  In the past six months, have you accidently leaked urine? Y  Comment wears depends at night  Do you have problems with loss of bowel control? N  Managing your Medications? N  Managing your Finances? N  Housekeeping or managing your Housekeeping? N    Patient Care Team: Colene Dauphin, MD as PCP - General (Internal Medicine) Charity Conch, DPM as Consulting Physician (Podiatry) Diamond Formica, MD as Consulting Physician (Pulmonary Disease)  Nandigam, Kavitha V, MD as Consulting Physician (Gastroenterology) Szabat, Tino Foreman, [facility] (Inactive) as Pharmacist (Pharmacist)  Indicate any recent Medical Services you may have received from other than [facility] providers in the past year (date may be approximate).     Assessment:   This is a routine wellness examination for Steven Matthews.  Hearing/Vision screen Hearing Screening - Comments:: Denies hearing difficulties   Vision Screening - Comments:: Needs glasses   Goals Addressed   None    Depression Screen     03/27/2024    3:57 PM 02/21/2024    1:00 PM 02/15/2023    1:39 PM 08/17/2022    1:48 PM 03/02/2022    3:09 PM 02/10/2021    2:33 PM 02/06/2020    2:32 PM  PHQ 2/9 Scores  PHQ - 2 Score 0 0 0 0 0 0   PHQ- 9 Score 0  0 0     Exception Documentation       Other- indicate reason in comment box  Not completed       Dementia    Fall Risk     03/27/2024    3:55 PM 02/21/2024    1:00 PM 02/15/2023    1:39 PM 03/02/2022    3:17 PM 02/10/2021    2:35 PM  Fall Risk   Falls in the past year? 0 0 1 0 0  Number falls in past yr: 0 0 0 0 0  Injury with Fall? 0 0 0 0 0  Risk for fall due to : No Fall Risks;Medication side effect;Impaired balance/gait No Fall Risks History of fall(s) No Fall Risks No Fall Risks  Follow up Falls prevention discussed;Falls  evaluation completed Falls evaluation completed Falls evaluation completed Falls evaluation completed Falls evaluation completed    MEDICARE RISK AT HOME:  Medicare Risk at Home Any stairs in or around the home?: Yes If so, are there any without handrails?: Yes Home free of loose throw rugs in walkways, pet beds, electrical cords, etc?: Yes Adequate lighting in your home to reduce risk of falls?: Yes Life alert?: No Use of a cane, walker or w/c?: No Grab bars in the bathroom?: No Shower chair or bench in shower?: No Elevated toilet seat or a handicapped toilet?: No  TIMED UP AND GO:  Was the test performed?  No  Cognitive Function: Impaired: Patient has current diagnosis of cognitive impairment.    03/02/2022    3:19 PM 02/10/2021    2:40 PM 08/16/2017   10:46 AM  MMSE - Mini Mental State Exam  Not completed: Unable to complete Unable to complete Unable to complete        Immunizations Immunization History  Administered Date(s) Administered   Fluad Quad(high Dose 65+) 02/10/2021, 08/11/2021, 08/17/2022   Fluad Trivalent(High Dose 65+) 08/23/2023   Influenza, High Dose Seasonal PF 08/03/2016, 08/16/2017, 08/22/2018   Influenza,inj,Quad PF,6+ Mos 10/18/2015   Janssen (J&J) SARS-COV-2 Vaccination 04/05/2020   Pneumococcal Polysaccharide-23 06/09/2006    Screening Tests Health Maintenance  Topic Date Due   OPHTHALMOLOGY EXAM  Never done   DTaP/Tdap/Td (1 - Tdap) Never done   Zoster Vaccines- Shingrix (1 of 2) Never done   Pneumonia Vaccine 32+ Years old (2 of 2 - PCV) 06/10/2007   Medicare Annual Wellness (AWV)  03/03/2023   COVID-19 Vaccine (2 - 2024-25 season) 07/25/2023   INFLUENZA VACCINE  06/23/2024   HEMOGLOBIN A1C  08/22/2024   HPV VACCINES  Aged Out   Meningococcal B Vaccine  Aged Out  FOOT EXAM  Discontinued    Health Maintenance  Health Maintenance Due  Topic Date Due   OPHTHALMOLOGY EXAM  Never done   DTaP/Tdap/Td (1 - Tdap) Never done   Zoster  Vaccines- Shingrix (1 of 2) Never done   Pneumonia Vaccine 17+ Years old (2 of 2 - PCV) 06/10/2007   Medicare Annual Wellness (AWV)  03/03/2023   COVID-19 Vaccine (2 - 2024-25 season) 07/25/2023   Health Maintenance Items Addressed: See Nurse Notes  Additional Screening:  Vision Screening: Recommended annual ophthalmology exams for early detection of glaucoma and other disorders of the eye.  Dental Screening: Recommended annual dental exams for proper oral hygiene  Community Resource Referral / Chronic Care Management: CRR required this visit?  No   CCM required this visit?  No     Plan:     I have personally reviewed and noted the following in the patient's chart:   Medical and social history Use of alcohol, tobacco or illicit drugs  Current medications and supplements including opioid prescriptions. Patient is not currently taking opioid prescriptions. Functional ability and status Nutritional status Physical activity Advanced directives List of other physicians Hospitalizations, surgeries, and ER visits in previous 12 months Vitals Screenings to include cognitive, depression, and falls Referrals and appointments  In addition, I have reviewed and discussed with patient certain preventive protocols, quality metrics, and best practice recommendations. A written personalized care plan for preventive services as well as general preventive health recommendations were provided to patient.     Kristeena Meineke L Tiya Schrupp, CMA   03/27/2024   After Visit Summary: (MyChart) Due to this being a telephonic visit, the after visit summary with patients personalized plan was offered to patient via MyChart   Notes: Please refer to Routing Comments.

## 2024-05-01 ENCOUNTER — Ambulatory Visit (INDEPENDENT_AMBULATORY_CARE_PROVIDER_SITE_OTHER): Admitting: Podiatry

## 2024-05-01 ENCOUNTER — Encounter: Payer: Self-pay | Admitting: Podiatry

## 2024-05-01 DIAGNOSIS — B351 Tinea unguium: Secondary | ICD-10-CM

## 2024-05-01 DIAGNOSIS — M79674 Pain in right toe(s): Secondary | ICD-10-CM | POA: Diagnosis not present

## 2024-05-01 DIAGNOSIS — E1169 Type 2 diabetes mellitus with other specified complication: Secondary | ICD-10-CM | POA: Diagnosis not present

## 2024-05-01 DIAGNOSIS — D689 Coagulation defect, unspecified: Secondary | ICD-10-CM | POA: Diagnosis not present

## 2024-05-01 DIAGNOSIS — M79675 Pain in left toe(s): Secondary | ICD-10-CM | POA: Diagnosis not present

## 2024-05-07 NOTE — Progress Notes (Signed)
  Subjective:  Patient ID: Steven Matthews, male    DOB: 1931-10-20,  MRN: [id number]  Steven Matthews presents to clinic today for preventative diabetic foot care and clotting disorder for painful elongated mycotic toenails 1-5 bilaterally which are tender when wearing enclosed shoe gear. Pain is relieved with periodic professional debridement. He is accompanied by his niece on today's visit.  Chief Complaint  Patient presents with   Nail Problem    Thick painful toenails, 3 month follow up   New problem(s): None.   PCP is Colene Dauphin, MD. Holli Lunger 02/21/2024.  No Known Allergies  Review of Systems: Negative except as noted in the HPI.  Objective: No changes noted in today's physical examination. There were no vitals filed for this visit. Steven Matthews is a pleasant 89 y.o. male WD, WN in NAD. AAO x 3.  Vascular Examination: CFT <3 seconds b/l. DP/PT pulses faintly palpable b/l. Skin temperature gradient warm to warm b/l. No pain with calf compression. No ischemia or gangrene. No cyanosis or clubbing noted b/l. Trace edema noted left lower extremity. Evidence of skin changes consistent with long term venous stasis LLE.   Neurological Examination: Sensation grossly intact b/l with 10 gram monofilament.   Dermatological Examination: Pedal skin warm and supple b/l.   No open wounds. No interdigital macerations.  Toenails 1-5 b/l thick, discolored, elongated with subungual debris and pain on dorsal palpation.    Toenails 1-5 b/l well maintained with adequate length. No erythema, no edema, no drainage, no fluctuance.  Musculoskeletal Examination: Muscle strength 5/5 to all lower extremity muscle groups bilaterally. No pain, crepitus or joint limitation noted with ROM b/l LE. No gross bony pedal deformities b/l. Patient ambulates independently without assistive aids.  Radiographs: None  Last A1c:      Latest Ref Rng & Units 02/21/2024    1:21 PM 08/23/2023    1:38 PM  Hemoglobin A1C   Hemoglobin-A1c 4.6 - 6.5 % 6.0  5.9     Assessment/Plan: 1. Pain due to onychomycosis of toenails of both feet   2. Clotting disorder (HCC)   3. Type 2 diabetes mellitus with other specified complication, without long-term current use of insulin [facility]   Consent given for treatment. Patient examined. All patient's and/or POA's questions/concerns addressed on today's visit. Mycotic toenails 1-5 debrided in length and girth without incident. Continue soft, supportive shoe gear daily. Report any pedal injuries to medical professional. Call office if there are any quesitons/concerns. -Patient/POA to call should there be question/concern in the interim.   Return in about 3 months (around 08/01/2024).  Luella Sager, DPM      [place] LOCATION: [address], [place] [address]                   Office [phone]   [facility] LOCATION: [address] [place], [place] [address] Office [phone]

## 2024-07-09 ENCOUNTER — Other Ambulatory Visit: Payer: Self-pay | Admitting: Internal Medicine

## 2024-08-03 ENCOUNTER — Other Ambulatory Visit: Payer: Self-pay | Admitting: Internal Medicine

## 2024-08-07 ENCOUNTER — Encounter: Payer: Self-pay | Admitting: Podiatry

## 2024-08-07 ENCOUNTER — Ambulatory Visit (INDEPENDENT_AMBULATORY_CARE_PROVIDER_SITE_OTHER): Admitting: Podiatry

## 2024-08-07 DIAGNOSIS — M79674 Pain in right toe(s): Secondary | ICD-10-CM | POA: Diagnosis not present

## 2024-08-07 DIAGNOSIS — B351 Tinea unguium: Secondary | ICD-10-CM | POA: Diagnosis not present

## 2024-08-07 DIAGNOSIS — M79675 Pain in left toe(s): Secondary | ICD-10-CM | POA: Diagnosis not present

## 2024-08-07 DIAGNOSIS — E1169 Type 2 diabetes mellitus with other specified complication: Secondary | ICD-10-CM | POA: Diagnosis not present

## 2024-08-07 DIAGNOSIS — D689 Coagulation defect, unspecified: Secondary | ICD-10-CM | POA: Diagnosis not present

## 2024-08-10 NOTE — Progress Notes (Signed)
  Subjective:  Patient ID: Steven Matthews, male    DOB: 1930/12/10,  MRN: [id number]  Steven Matthews presents to clinic today for at risk foot care. Patient has history of diabetes and clotting disorder and painful mycotic toenails of both feet that are difficult to trim. Pain interferes with daily activities and wearing enclosed shoe gear comfortably. His niece, Steven Matthews, is present during today's visit. They voice no new pedal concerns on today's visit.  New problem(s): None.   PCP is Geofm Glade PARAS, MD. ARNETTA 02/21/2024.  No Known Allergies  Review of Systems: Negative except as noted in the HPI.  Objective: No changes noted in today's physical examination. There were no vitals filed for this visit. Steven Matthews is a pleasant 89 y.o. male WD, WN in NAD. AAO x 3.  Vascular Examination: CFT <3 seconds b/l. DP/PT pulses faintly palpable b/l. Skin temperature gradient warm to warm b/l. No pain with calf compression. No ischemia or gangrene. No cyanosis or clubbing noted b/l. Trace edema noted left lower extremity. Evidence of skin changes consistent with long term venous stasis LLE.   Neurological Examination: Sensation grossly intact b/l with 10 gram monofilament.   Dermatological Examination: Pedal skin warm and supple b/l.   No open wounds. No interdigital macerations.  Toenails 1-5 b/l thick, discolored, elongated with subungual debris and pain on dorsal palpation.    Toenails 1-5 b/l well maintained with adequate length. No erythema, no edema, no drainage, no fluctuance.  Musculoskeletal Examination: Muscle strength 5/5 to all lower extremity muscle groups bilaterally. No pain, crepitus or joint limitation noted with ROM b/l LE. No gross bony pedal deformities b/l. Patient ambulates independently without assistive aids.  Radiographs: None  Assessment/Plan: 1. Pain due to onychomycosis of toenails of both feet   2. Clotting disorder (HCC)   3. Type 2 diabetes mellitus with other specified  complication, without long-term current use of insulin (HCC)   Patient was evaluated and treated. All patient's and/or POA's questions/concerns addressed on today's visit. Toenails 1-5 debrided in length and girth without incident. Continue foot and shoe inspections daily. Monitor blood glucose per PCP/Endocrinologist's recommendations. Continue soft, supportive shoe gear daily. Report any pedal injuries to medical professional. Call office if there are any questions/concerns. -Patient/POA to call should there be question/concern in the interim.   Return in about 3 months (around 11/06/2024).  [name], DPM      [place] LOCATION: [address], [place] [address]                   Office [phone]   [facility] LOCATION: [address] [place], [place] [address] Office [phone]

## 2024-08-20 ENCOUNTER — Encounter: Payer: Self-pay | Admitting: Internal Medicine

## 2024-08-20 NOTE — Patient Instructions (Addendum)
    Flu immunization administered today.     Blood work was ordered.       Medications changes include :   None     Return in about 6 months (around 02/18/2025) for follow up.

## 2024-08-20 NOTE — Progress Notes (Unsigned)
      Subjective:    Patient ID: Steven Matthews, male    DOB: 08/23/1931, 89 y.o.   MRN: [id number]     HPI Steven Matthews is here for follow up of his chronic medical problems.    Medications and allergies reviewed with patient and updated if appropriate.  Current Outpatient Medications on File Prior to Visit  Medication Sig Dispense Refill   acetaminophen  (TYLENOL ) 650 MG CR tablet Take 650 mg by mouth every 8 (eight) hours as needed for pain.     ALPRAZolam  (XANAX ) 0.25 MG tablet TAKE 1 TABLET(0.25 MG) BY MOUTH EVERY 6 HOURS AS NEEDED 60 tablet 5   amLODipine  (NORVASC ) 10 MG tablet TAKE 1 TABLET(10 MG) BY MOUTH DAILY 90 tablet 2   ELIQUIS  2.5 MG TABS tablet TAKE 1 TABLET(2.5 MG) BY MOUTH TWICE DAILY 60 tablet 5   levothyroxine  (SYNTHROID ) 88 MCG tablet TAKE 1 TABLET(88 MCG) BY MOUTH DAILY 90 tablet 3   pantoprazole  (PROTONIX ) 40 MG tablet TAKE 1 TABLET BY MOUTH TWICE DAILY 30 MINUTES BEFORE BREAKFAST AND DINNER 180 tablet 1   pravastatin  (PRAVACHOL ) 80 MG tablet TAKE 1 TABLET(80 MG) BY MOUTH AT BEDTIME 90 tablet 1   tamsulosin  (FLOMAX ) 0.4 MG CAPS capsule TAKE 1 CAPSULE(0.4 MG) BY MOUTH DAILY AFTER SUPPER 90 capsule 2   No current facility-administered medications on file prior to visit.     Review of Systems     Objective:  There were no vitals filed for this visit. BP Readings from Last 3 Encounters:  02/21/24 134/62  08/23/23 132/80  02/15/23 138/82   Wt Readings from Last 3 Encounters:  03/27/24 151 lb (68.5 kg)  02/21/24 151 lb (68.5 kg)  11/01/23 157 lb (71.2 kg)   There is no height or weight on file to calculate BMI.    Physical Exam     Lab Results  Component Value Date   WBC 6.4 02/21/2024   HGB 13.4 02/21/2024   HCT 41.5 02/21/2024   PLT 220.0 02/21/2024   GLUCOSE 104 (H) 02/21/2024   CHOL 183 02/21/2024   TRIG 131.0 02/21/2024   HDL 65.10 02/21/2024   LDLCALC 91 02/21/2024   ALT 13 02/21/2024   AST 19 02/21/2024   NA 140 02/21/2024   K 3.9 02/21/2024    CL 103 02/21/2024   CREATININE 1.64 (H) 02/21/2024   BUN 21 02/21/2024   CO2 28 02/21/2024   TSH 4.51 02/21/2024   HGBA1C 6.0 02/21/2024     Assessment & Plan:    See Problem List for Assessment and Plan of chronic medical problems.

## 2024-08-21 ENCOUNTER — Ambulatory Visit: Admitting: Internal Medicine

## 2024-08-21 VITALS — BP 122/78 | HR 89 | Temp 98.7°F | Ht 65.0 in | Wt 150.0 lb

## 2024-08-21 DIAGNOSIS — E1159 Type 2 diabetes mellitus with other circulatory complications: Secondary | ICD-10-CM | POA: Diagnosis not present

## 2024-08-21 DIAGNOSIS — F419 Anxiety disorder, unspecified: Secondary | ICD-10-CM

## 2024-08-21 DIAGNOSIS — K219 Gastro-esophageal reflux disease without esophagitis: Secondary | ICD-10-CM

## 2024-08-21 DIAGNOSIS — I82402 Acute embolism and thrombosis of unspecified deep veins of left lower extremity: Secondary | ICD-10-CM

## 2024-08-21 DIAGNOSIS — N1832 Chronic kidney disease, stage 3b: Secondary | ICD-10-CM | POA: Diagnosis not present

## 2024-08-21 DIAGNOSIS — E1122 Type 2 diabetes mellitus with diabetic chronic kidney disease: Secondary | ICD-10-CM

## 2024-08-21 DIAGNOSIS — D649 Anemia, unspecified: Secondary | ICD-10-CM

## 2024-08-21 DIAGNOSIS — E1169 Type 2 diabetes mellitus with other specified complication: Secondary | ICD-10-CM

## 2024-08-21 DIAGNOSIS — I152 Hypertension secondary to endocrine disorders: Secondary | ICD-10-CM

## 2024-08-21 DIAGNOSIS — N138 Other obstructive and reflux uropathy: Secondary | ICD-10-CM

## 2024-08-21 DIAGNOSIS — Z23 Encounter for immunization: Secondary | ICD-10-CM | POA: Diagnosis not present

## 2024-08-21 DIAGNOSIS — E039 Hypothyroidism, unspecified: Secondary | ICD-10-CM

## 2024-08-21 DIAGNOSIS — E785 Hyperlipidemia, unspecified: Secondary | ICD-10-CM | POA: Diagnosis not present

## 2024-08-21 LAB — COMPREHENSIVE METABOLIC PANEL WITH GFR
ALT: 10 U/L (ref 0–53)
AST: 17 U/L (ref 0–37)
Albumin: 4.5 g/dL (ref 3.5–5.2)
Alkaline Phosphatase: 65 U/L (ref 39–117)
BUN: 24 mg/dL — ABNORMAL HIGH (ref 6–23)
CO2: 27 meq/L (ref 19–32)
Calcium: 10 mg/dL (ref 8.4–10.5)
Chloride: 105 meq/L (ref 96–112)
Creatinine, Ser: 1.84 mg/dL — ABNORMAL HIGH (ref 0.40–1.50)
GFR: 31.64 mL/min — ABNORMAL LOW (ref >60.00)
Glucose, Bld: 108 mg/dL — ABNORMAL HIGH (ref 70–99)
Potassium: 4 meq/L (ref 3.5–5.1)
Sodium: 141 meq/L (ref 135–145)
Total Bilirubin: 0.7 mg/dL (ref 0.2–1.2)
Total Protein: 8.4 g/dL — ABNORMAL HIGH (ref 6.0–8.3)

## 2024-08-21 LAB — CBC WITH DIFFERENTIAL/PLATELET
Basophils Absolute: 0 K/uL (ref 0.0–0.1)
Basophils Relative: 0.3 % (ref 0.0–3.0)
Eosinophils Absolute: 0 K/uL (ref 0.0–0.7)
Eosinophils Relative: 0.2 % (ref 0.0–5.0)
HCT: 42.4 % (ref 39.0–52.0)
Hemoglobin: 13.8 g/dL (ref 13.0–17.0)
Lymphocytes Relative: 13 % (ref 12.0–46.0)
Lymphs Abs: 1.1 K/uL (ref 0.7–4.0)
MCHC: 32.6 g/dL (ref 30.0–36.0)
MCV: 86.3 fl (ref 78.0–100.0)
Monocytes Absolute: 0.4 K/uL (ref 0.1–1.0)
Monocytes Relative: 4.6 % (ref 3.0–12.0)
Neutro Abs: 7.1 K/uL (ref 1.4–7.7)
Neutrophils Relative %: 81.9 % — ABNORMAL HIGH (ref 43.0–77.0)
Platelets: 214 K/uL (ref 150.0–400.0)
RBC: 4.92 Mil/uL (ref 4.22–5.81)
RDW: 15.5 % (ref 11.5–15.5)
WBC: 8.6 K/uL (ref 4.0–10.5)

## 2024-08-21 LAB — HEMOGLOBIN A1C: Hgb A1c MFr Bld: 6.5 % (ref 4.6–6.5)

## 2024-08-21 LAB — LIPID PANEL
Cholesterol: 179 mg/dL (ref 0–200)
HDL: 67.2 mg/dL (ref >39.00)
LDL Cholesterol: 93 mg/dL (ref 0–99)
NonHDL: 111.6
Total CHOL/HDL Ratio: 3
Triglycerides: 92 mg/dL (ref 0.0–149.0)
VLDL: 18.4 mg/dL (ref 0.0–40.0)

## 2024-08-21 MED ORDER — PRAVASTATIN SODIUM 80 MG PO TABS: ORAL_TABLET | ORAL | 1 refills | Status: AC

## 2024-08-21 MED ORDER — LEVOTHYROXINE SODIUM 88 MCG PO TABS: ORAL_TABLET | ORAL | Status: DC

## 2024-08-21 MED ORDER — APIXABAN 2.5 MG PO TABS: 2.5000 mg | ORAL_TABLET | Freq: Two times a day (BID) | ORAL | 5 refills | Status: DC

## 2024-08-21 MED ORDER — TAMSULOSIN HCL 0.4 MG PO CAPS: ORAL_CAPSULE | ORAL | 2 refills | Status: AC

## 2024-08-21 MED ORDER — AMLODIPINE BESYLATE 10 MG PO TABS: ORAL_TABLET | ORAL | 2 refills | Status: AC

## 2024-08-21 NOTE — Assessment & Plan Note (Signed)
Chronic Regular exercise and healthy diet encouraged Check lipid panel, cmp Continue pravastatin 80 mg daily

## 2024-08-21 NOTE — Assessment & Plan Note (Signed)
 Chronic GERD controlled Continue pantoprazole 40 mg daily

## 2024-08-21 NOTE — Assessment & Plan Note (Addendum)
 Chronic  Clinically euthyroid Check tsh and will titrate med dose if needed Currently taking levothyroxine  88 mcg 6 days a week

## 2024-08-21 NOTE — Assessment & Plan Note (Addendum)
 Chronic BP controlled Continue amlodipine  10 mg daily cmp

## 2024-08-21 NOTE — Assessment & Plan Note (Signed)
Chronic Controlled Continue Flomax 0.4 mg daily Following with urology

## 2024-08-21 NOTE — Assessment & Plan Note (Signed)
 Chronic Stage 3b Has been stable CMP, cbc

## 2024-08-21 NOTE — Assessment & Plan Note (Signed)
 Chronic  Recurrent DVT Continue eliquis 2.5 mg bid Cbc, cmp

## 2024-08-21 NOTE — Assessment & Plan Note (Signed)
Chronic Controlled, Stable Continue alprazolam 0.25 mg every 6 hours as needed 

## 2024-08-21 NOTE — Assessment & Plan Note (Signed)
 Chronic Mild Likely related to CKD Cmp, cbc

## 2024-08-21 NOTE — Addendum Note (Signed)
 Addended by: CLAUDENE TOBIAS PARAS on: 08/21/2024 01:59 PM   Modules accepted: Orders

## 2024-08-21 NOTE — Assessment & Plan Note (Signed)
 Chronic Associated with CKD stage 3b, hyperlipidemia  Lab Results  Component Value Date   HGBA1C 6.0 02/21/2024   Check A1c Diet controlled Low sugar / carb diet Stressed regular exercise

## 2024-08-22 LAB — TSH: TSH: 0.64 u[IU]/mL (ref 0.35–5.50)

## 2024-08-24 ENCOUNTER — Ambulatory Visit: Payer: Self-pay | Admitting: Internal Medicine

## 2024-09-10 ENCOUNTER — Other Ambulatory Visit: Payer: Self-pay | Admitting: Internal Medicine

## 2024-11-02 ENCOUNTER — Other Ambulatory Visit: Payer: Self-pay | Admitting: Internal Medicine

## 2024-11-06 ENCOUNTER — Ambulatory Visit (INDEPENDENT_AMBULATORY_CARE_PROVIDER_SITE_OTHER): Admitting: Podiatry

## 2024-11-06 DIAGNOSIS — B351 Tinea unguium: Secondary | ICD-10-CM

## 2024-11-06 DIAGNOSIS — M79674 Pain in right toe(s): Secondary | ICD-10-CM | POA: Diagnosis not present

## 2024-11-06 DIAGNOSIS — E1169 Type 2 diabetes mellitus with other specified complication: Secondary | ICD-10-CM

## 2024-11-06 DIAGNOSIS — D689 Coagulation defect, unspecified: Secondary | ICD-10-CM | POA: Diagnosis not present

## 2024-11-06 DIAGNOSIS — M79675 Pain in left toe(s): Secondary | ICD-10-CM

## 2024-11-10 ENCOUNTER — Telehealth: Payer: Self-pay | Admitting: Pharmacist

## 2024-11-10 NOTE — Progress Notes (Unsigned)
 Pharmacy Quality Measure Review  This patient is appearing on a report for being at risk of failing the adherence measure for cholesterol (statin) medications this calendar year.   Medication: Pravastatin  Last fill date: 06/07/24 for 90 day supply  Left voicemail for patient to return my call at their convenience. and MyChart message sent to patient.  Darrelyn Drum, PharmD, BCPS, CPP Clinical Pharmacist Practitioner [facility] [phone]

## 2024-11-12 ENCOUNTER — Encounter: Payer: Self-pay | Admitting: Podiatry

## 2024-11-12 NOTE — Progress Notes (Signed)
"  °  Subjective:  Patient ID: Steven Matthews, male    DOB: 18-Apr-1931,  MRN: [id number]  [name] presents to clinic today for at risk foot care with h/o clotting disorder and diabetes. He is seen for painful elongated mycotic toenails 1-5 bilaterally which are tender when wearing enclosed shoe gear. Pain is relieved with periodic professional debridement. Patient is accompanied by his niece on today's visit. Chief Complaint  Patient presents with   Diabetes    A1c 6.5 He denies being diabetic. He saw Dr. Geofm in Sept   New problem(s): None.   PCP is Geofm Glade PARAS, MD.  Allergies[1]  Review of Systems: Negative except as noted in the HPI.  Objective: No changes noted in today's physical examination. There were no vitals filed for this visit. Steven Matthews is a pleasant 89 y.o. male in NAD. AAO x 3.  Vascular Examination: CFT <3 seconds b/l. DP/PT pulses faintly palpable b/l. Skin temperature gradient warm to warm b/l. No pain with calf compression. No ischemia or gangrene. No cyanosis or clubbing noted b/l. Trace edema noted left lower extremity. Evidence of skin changes consistent with long term venous stasis LLE.   Neurological Examination: Sensation grossly intact b/l with 10 gram monofilament.   Dermatological Examination: Pedal skin warm and supple b/l.   No open wounds. No interdigital macerations.  Toenails 1-5 b/l thick, discolored, elongated with subungual debris and pain on dorsal palpation.    Toenails 1-5 b/l well maintained with adequate length. No erythema, no edema, no drainage, no fluctuance.  Musculoskeletal Examination: Muscle strength 5/5 to all lower extremity muscle groups bilaterally. No pain, crepitus or joint limitation noted with ROM b/l LE. No gross bony pedal deformities b/l. Patient ambulates independently without assistive aids.  Radiographs: None  Assessment/Plan: 1. Pain due to onychomycosis of toenails of both feet   2. Clotting disorder   3. Type  2 diabetes mellitus with other specified complication, without long-term current use of insulin [facility])   Consent given for treatment. Patient examined. All patient's and/or POA's questions/concerns addressed on today's visit. Mycotic toenails 1-5 b/l debrided in length and girth without incident. Continue foot and shoe inspections daily. Monitor blood glucose per PCP/Endocrinologist's recommendations.Continue soft, supportive shoe gear daily. Report any pedal injuries to medical professional. Call office if there are any quesitons/concerns. -Patient/POA to call should there be question/concern in the interim.   Return in about 3 months (around 02/04/2025).  Steven Matthews, DPM      [facility] LOCATION: [address] [place] [address]                   Office [phone]   [facility] LOCATION: [address] [place], [place] [address] Office [phone]     [1] No Known Allergies  "

## 2025-02-05 ENCOUNTER — Ambulatory Visit: Admitting: Podiatry

## 2025-02-19 ENCOUNTER — Ambulatory Visit: Admitting: Internal Medicine

## 2025-03-28 ENCOUNTER — Ambulatory Visit
